# Patient Record
Sex: Male | Born: 1945 | Race: White | Hispanic: No | Smoking: Former smoker
Health system: Southern US, Community
[De-identification: ages and names within clinical notes are randomized; demographics above are authoritative.]

## PROBLEM LIST (undated history)

## (undated) DIAGNOSIS — J939 Pneumothorax, unspecified: Secondary | ICD-10-CM

## (undated) DIAGNOSIS — D45 Polycythemia vera: Secondary | ICD-10-CM

## (undated) DIAGNOSIS — D519 Vitamin B12 deficiency anemia, unspecified: Secondary | ICD-10-CM

## (undated) DIAGNOSIS — F0390 Unspecified dementia without behavioral disturbance: Secondary | ICD-10-CM

## (undated) DIAGNOSIS — N179 Acute kidney failure, unspecified: Secondary | ICD-10-CM

## (undated) DIAGNOSIS — J9819 Other pulmonary collapse: Secondary | ICD-10-CM

## (undated) DIAGNOSIS — C679 Malignant neoplasm of bladder, unspecified: Secondary | ICD-10-CM

## (undated) DIAGNOSIS — I639 Cerebral infarction, unspecified: Secondary | ICD-10-CM

## (undated) DIAGNOSIS — D751 Secondary polycythemia: Secondary | ICD-10-CM

## (undated) DIAGNOSIS — F03C0 Unspecified dementia, severe, without behavioral disturbance, psychotic disturbance, mood disturbance, and anxiety: Secondary | ICD-10-CM

## (undated) DIAGNOSIS — N189 Chronic kidney disease, unspecified: Secondary | ICD-10-CM

## (undated) HISTORY — DX: Cerebral infarction, unspecified: I63.9

## (undated) HISTORY — DX: Unspecified dementia without behavioral disturbance: F03.90

## (undated) HISTORY — DX: Other pulmonary collapse: J98.19

## (undated) HISTORY — DX: Vitamin B12 deficiency anemia, unspecified: D51.9

## (undated) HISTORY — DX: Malignant neoplasm of bladder, unspecified: C67.9

## (undated) HISTORY — DX: Acute kidney failure, unspecified: N17.9

## (undated) HISTORY — DX: Secondary polycythemia: D75.1

## (undated) HISTORY — DX: Acute kidney failure, unspecified: N18.9

## (undated) HISTORY — DX: Unspecified dementia, unspecified severity, without behavioral disturbance, psychotic disturbance, mood disturbance, and anxiety: F03.90

## (undated) HISTORY — DX: Unspecified dementia, severe, without behavioral disturbance, psychotic disturbance, mood disturbance, and anxiety: F03.C0

## (undated) HISTORY — DX: Polycythemia vera: D45

---

## 2005-03-19 ENCOUNTER — Emergency Department (HOSPITAL_COMMUNITY): Admission: EM | Admit: 2005-03-19 | Discharge: 2005-03-19 | Payer: Self-pay | Admitting: Emergency Medicine

## 2010-02-01 ENCOUNTER — Emergency Department (HOSPITAL_BASED_OUTPATIENT_CLINIC_OR_DEPARTMENT_OTHER): Admission: EM | Admit: 2010-02-01 | Discharge: 2010-02-02 | Payer: Self-pay | Admitting: Emergency Medicine

## 2010-02-01 ENCOUNTER — Ambulatory Visit: Payer: Self-pay | Admitting: Psychiatry

## 2010-02-02 ENCOUNTER — Inpatient Hospital Stay (HOSPITAL_COMMUNITY): Admission: RE | Admit: 2010-02-02 | Discharge: 2010-02-04 | Payer: Self-pay | Admitting: Psychiatry

## 2010-08-23 LAB — URINALYSIS, ROUTINE W REFLEX MICROSCOPIC
Bilirubin Urine: NEGATIVE
Glucose, UA: NEGATIVE mg/dL
Hgb urine dipstick: NEGATIVE
Specific Gravity, Urine: 1.015 (ref 1.005–1.030)
pH: 5 (ref 5.0–8.0)

## 2010-08-23 LAB — COMPREHENSIVE METABOLIC PANEL
ALT: 276 U/L — ABNORMAL HIGH (ref 0–53)
Albumin: 4.2 g/dL (ref 3.5–5.2)
Calcium: 9.1 mg/dL (ref 8.4–10.5)
Glucose, Bld: 89 mg/dL (ref 70–99)
Potassium: 4.4 mEq/L (ref 3.5–5.1)
Sodium: 141 mEq/L (ref 135–145)
Total Protein: 8.2 g/dL (ref 6.0–8.3)

## 2010-08-23 LAB — ETHANOL: Alcohol, Ethyl (B): 235 mg/dL — ABNORMAL HIGH (ref 0–10)

## 2010-08-23 LAB — CBC
HCT: 47.5 % (ref 39.0–52.0)
MCHC: 35.3 g/dL (ref 30.0–36.0)
Platelets: 174 10*3/uL (ref 150–400)
RDW: 12.5 % (ref 11.5–15.5)
WBC: 4.7 10*3/uL (ref 4.0–10.5)

## 2010-08-23 LAB — DIFFERENTIAL
Lymphs Abs: 1.7 10*3/uL (ref 0.7–4.0)
Monocytes Absolute: 0.4 10*3/uL (ref 0.1–1.0)
Monocytes Relative: 9 % (ref 3–12)
Neutro Abs: 2.4 10*3/uL (ref 1.7–7.7)
Neutrophils Relative %: 51 % (ref 43–77)

## 2010-08-23 LAB — POCT TOXICOLOGY PANEL

## 2010-08-23 LAB — PROTIME-INR: INR: 0.86 (ref 0.00–1.49)

## 2014-09-03 ENCOUNTER — Emergency Department (HOSPITAL_BASED_OUTPATIENT_CLINIC_OR_DEPARTMENT_OTHER): Payer: Medicare Other

## 2014-09-03 ENCOUNTER — Emergency Department (HOSPITAL_BASED_OUTPATIENT_CLINIC_OR_DEPARTMENT_OTHER)
Admission: EM | Admit: 2014-09-03 | Discharge: 2014-09-03 | Disposition: A | Discharge status: Other Acute Inpatient Hospital | Payer: Medicare Other | Attending: Emergency Medicine | Admitting: Emergency Medicine

## 2014-09-03 ENCOUNTER — Encounter (HOSPITAL_BASED_OUTPATIENT_CLINIC_OR_DEPARTMENT_OTHER): Payer: Self-pay | Admitting: *Deleted

## 2014-09-03 DIAGNOSIS — Z8709 Personal history of other diseases of the respiratory system: Secondary | ICD-10-CM | POA: Insufficient documentation

## 2014-09-03 DIAGNOSIS — Z87891 Personal history of nicotine dependence: Secondary | ICD-10-CM | POA: Diagnosis not present

## 2014-09-03 DIAGNOSIS — R42 Dizziness and giddiness: Secondary | ICD-10-CM

## 2014-09-03 HISTORY — DX: Pneumothorax, unspecified: J93.9

## 2014-09-03 LAB — URINALYSIS, ROUTINE W REFLEX MICROSCOPIC
GLUCOSE, UA: NEGATIVE mg/dL
Hgb urine dipstick: NEGATIVE
Ketones, ur: 15 mg/dL — AB
Nitrite: NEGATIVE
PROTEIN: NEGATIVE mg/dL
SPECIFIC GRAVITY, URINE: 1.015 (ref 1.005–1.030)
UROBILINOGEN UA: 0.2 mg/dL (ref 0.0–1.0)
pH: 5.5 (ref 5.0–8.0)

## 2014-09-03 LAB — RAPID URINE DRUG SCREEN, HOSP PERFORMED
AMPHETAMINES: NOT DETECTED
BARBITURATES: NOT DETECTED
BENZODIAZEPINES: NOT DETECTED
COCAINE: NOT DETECTED
Opiates: NOT DETECTED
Tetrahydrocannabinol: NOT DETECTED

## 2014-09-03 LAB — CBC
HEMATOCRIT: 69.1 % — AB (ref 39.0–52.0)
HEMATOCRIT: 67.1 % — AB (ref 39.0–52.0)
HEMOGLOBIN: 24 g/dL — AB (ref 13.0–17.0)
Hemoglobin: 23.2 g/dL (ref 13.0–17.0)
MCH: 35.6 pg — ABNORMAL HIGH (ref 26.0–34.0)
MCH: 35.5 pg — ABNORMAL HIGH (ref 26.0–34.0)
MCHC: 34.7 g/dL (ref 30.0–36.0)
MCHC: 34.6 g/dL (ref 30.0–36.0)
MCV: 102.5 fL — ABNORMAL HIGH (ref 78.0–100.0)
MCV: 102.8 fL — ABNORMAL HIGH (ref 78.0–100.0)
Platelets: 140 10*3/uL — ABNORMAL LOW (ref 150–400)
Platelets: 125 10*3/uL — ABNORMAL LOW (ref 150–400)
RBC: 6.74 MIL/uL — AB (ref 4.22–5.81)
RBC: 6.53 MIL/uL — AB (ref 4.22–5.81)
RDW: 15.4 % (ref 11.5–15.5)
RDW: 14.9 % (ref 11.5–15.5)
WBC: 5.3 10*3/uL (ref 4.0–10.5)
WBC: 5.3 10*3/uL (ref 4.0–10.5)

## 2014-09-03 LAB — COMPREHENSIVE METABOLIC PANEL
ALK PHOS: 52 U/L (ref 39–117)
ALT: 15 U/L (ref 0–53)
AST: 21 U/L (ref 0–37)
Albumin: 4 g/dL (ref 3.5–5.2)
Anion gap: 8 (ref 5–15)
BILIRUBIN TOTAL: 2 mg/dL — AB (ref 0.3–1.2)
BUN: 11 mg/dL (ref 6–23)
CO2: 29 mmol/L (ref 19–32)
Calcium: 9.4 mg/dL (ref 8.4–10.5)
Chloride: 100 mmol/L (ref 96–112)
Creatinine, Ser: 0.98 mg/dL (ref 0.50–1.35)
GFR calc Af Amer: >90 mL/min (ref >90)
GFR, EST NON AFRICAN AMERICAN: 82 mL/min — AB (ref >90)
GLUCOSE: 104 mg/dL — AB (ref 70–99)
POTASSIUM: 4.1 mmol/L (ref 3.5–5.1)
Sodium: 137 mmol/L (ref 135–145)
TOTAL PROTEIN: 7.5 g/dL (ref 6.0–8.3)

## 2014-09-03 LAB — PROTIME-INR
INR: 0.98 (ref 0.00–1.49)
Prothrombin Time: 13 seconds (ref 11.6–15.2)

## 2014-09-03 LAB — URINE MICROSCOPIC-ADD ON

## 2014-09-03 LAB — APTT: APTT: 27 s (ref 24–37)

## 2014-09-03 LAB — TROPONIN I

## 2014-09-03 MED ORDER — SODIUM CHLORIDE 0.9 % IV BOLUS (SEPSIS)
1000.0000 mL | INTRAVENOUS | Status: AC
  Administered 2014-09-03: 1000 mL via INTRAVENOUS

## 2014-09-03 MED ORDER — HYDRALAZINE HCL 20 MG/ML IJ SOLN
5.0000 mg | Freq: Once | INTRAMUSCULAR | Status: AC
  Administered 2014-09-03: 5 mg via INTRAVENOUS
  Filled 2014-09-03: qty 1

## 2014-09-03 NOTE — ED Notes (Signed)
Pt reports dizziness since yesterday am- denies pain- drips strong facial symmetry present- went to [facility] and was found to be hypertensive and was sent here for further eval

## 2014-09-03 NOTE — ED Notes (Signed)
CRITICAL VALUE ALERT  Critical value received:  Hgb 24.0  Date of notification:  09/03/2014  Time of notification:  8250  Critical value read back:Yes.    Nurse who received alert:  Glade Nurse, RN  MD notified (1st page):  Dr. Aline Brochure  Time of first page:  1619 (Dr. Aline Brochure in department)  MD notified (2nd page):  Time of second page:  Responding MD:  Dr. Aline Brochure  Time MD responded:  [phone]

## 2014-09-03 NOTE — ED Notes (Signed)
Lab called with panic Hgb-24

## 2014-09-03 NOTE — ED Notes (Signed)
CRITICAL VALUE ALERT  Critical value received:  Hgb 23.2  Date of notification:  09/03/2014  Time of notification:  3794  Critical value read back:Yes.    Nurse who received alert:  Annie Paras RN  MD notified (1st page):  Dr. Aline Brochure (in department)  Time of first page:  1745  MD notified (2nd page):  Time of second page:  Responding MD:  Dr. Aline Brochure  Time MD responded:  [phone]

## 2014-09-03 NOTE — ED Provider Notes (Signed)
CSN: [id number]     Arrival date & time 09/03/14  1412 History   First MD Initiated Contact with Patient 09/03/14 1449     Chief Complaint  Patient presents with  . Dizziness     (Consider location/radiation/quality/duration/timing/severity/associated sxs/prior Treatment) Patient is a 69 y.o. male presenting with dizziness. The history is provided by the patient.  Dizziness Quality:  Imbalance Severity:  Mild Onset quality:  Gradual Duration:  1 day Timing:  Constant Progression:  Unchanged Chronicity:  New Context: standing up   Relieved by:  Nothing Worsened by:  Standing up Ineffective treatments:  None tried Associated symptoms: no chest pain, no diarrhea, no headaches, no nausea, no shortness of breath and no vomiting     Past Medical History  Diagnosis Date  . Pneumothorax    History reviewed. No pertinent past surgical history. No family history on file. History  Substance Use Topics  . Smoking status: Former Research scientist (life sciences)  . Smokeless tobacco: Current User    Types: Chew  . Alcohol Use: 1.8 oz/week    3 Cans of beer per week     Comment: 3 beers/day    Review of Systems  Constitutional: Negative for fever.  HENT: Negative for drooling and rhinorrhea.   Eyes: Negative for pain.  Respiratory: Negative for cough and shortness of breath.   Cardiovascular: Negative for chest pain and leg swelling.  Gastrointestinal: Negative for nausea, vomiting, abdominal pain and diarrhea.  Genitourinary: Negative for dysuria and hematuria.  Musculoskeletal: Negative for gait problem and neck pain.  Skin: Negative for color change.  Neurological: Positive for dizziness. Negative for numbness and headaches.  Hematological: Negative for adenopathy.  Psychiatric/Behavioral: Negative for behavioral problems.  All other systems reviewed and are negative.     Allergies  Review of patient's allergies indicates no known allergies.  Home Medications   Prior to Admission  medications   Not on File   BP 215/97 mmHg  Pulse 65  Temp(Src) 97.6 F (36.4 C) (Oral)  Resp 16  Ht 5\' 6"  (1.676 m)  Wt 150 lb (68.04 kg)  BMI 24.22 kg/m2  SpO2 100% Physical Exam  Constitutional: He is oriented to person, place, and time. He appears well-developed and well-nourished.  HENT:  Head: Normocephalic and atraumatic.  Right Ear: External ear normal.  Left Ear: External ear normal.  Nose: Nose normal.  Mouth/Throat: Oropharynx is clear and moist. No oropharyngeal exudate.  Eyes: Conjunctivae and EOM are normal. Pupils are equal, round, and reactive to light.  Neck: Normal range of motion. Neck supple.  Cardiovascular: Normal rate, regular rhythm, normal heart sounds and intact distal pulses.  Exam reveals no gallop and no friction rub.   No murmur heard. Pulmonary/Chest: Effort normal and breath sounds normal. No respiratory distress. He has no wheezes.  Abdominal: Soft. Bowel sounds are normal. He exhibits no distension. There is no tenderness. There is no rebound and no guarding.  Musculoskeletal: Normal range of motion. He exhibits no edema or tenderness.  Neurological: He is alert and oriented to person, place, and time.  alert, oriented x3 speech: normal in context and clarity memory: intact grossly cranial nerves II-XII: intact motor strength: full proximally and distally no involuntary movements or tremors sensation: intact to light touch diffusely  cerebellar: finger-to-nose and heel-to-shin intact gait: The patient leans slightly to the left having to catch his balance when walking forwards and backwards., Romberg negative.  Skin: Skin is warm and dry.  Mild macular rash noted on the lateral  aspect of the left shin, lateral aspect of the right hip, and lower back.  Psychiatric: He has a normal mood and affect. His behavior is normal.  Nursing note and vitals reviewed.   ED Course  Procedures (including critical care time) Labs Review Labs Reviewed   CBC - Abnormal; Notable for the following:    RBC 6.74 (*)    Hemoglobin 24.0 (*)    HCT 69.1 (*)    MCV 102.5 (*)    MCH 35.6 (*)    Platelets 140 (*)    All other components within normal limits  COMPREHENSIVE METABOLIC PANEL - Abnormal; Notable for the following:    Glucose, Bld 104 (*)    Total Bilirubin 2.0 (*)    GFR calc non Af Amer 82 (*)    All other components within normal limits  URINALYSIS, ROUTINE W REFLEX MICROSCOPIC - Abnormal; Notable for the following:    Color, Urine AMBER (*)    APPearance CLOUDY (*)    Bilirubin Urine SMALL (*)    Ketones, ur 15 (*)    Leukocytes, UA TRACE (*)    All other components within normal limits  CBC - Abnormal; Notable for the following:    RBC 6.53 (*)    Hemoglobin 23.2 (*)    HCT 67.1 (*)    MCV 102.8 (*)    MCH 35.5 (*)    Platelets 125 (*)    All other components within normal limits  URINE RAPID DRUG SCREEN (HOSP PERFORMED)  TROPONIN I  URINE MICROSCOPIC-ADD ON  PROTIME-INR  APTT    Imaging Review Dg Chest 2 View  09/03/2014   CLINICAL DATA:  69 year old male with dizziness since yesterday morning.  EXAM: CHEST  2 VIEW  COMPARISON:  No priors.  FINDINGS: Lung volumes are normal. No consolidative airspace disease. No pleural effusions. No pneumothorax. No pulmonary nodule or mass noted. Pulmonary vasculature and the cardiomediastinal silhouette are within normal limits.  IMPRESSION: No radiographic evidence of acute cardiopulmonary disease.   Electronically Signed   By: Vinnie Langton M.D.   On: 09/03/2014 15:45   Ct Head Wo Contrast  09/03/2014   CLINICAL DATA:  Dizziness and gait disturbance  EXAM: CT HEAD WITHOUT CONTRAST  TECHNIQUE: Contiguous axial images were obtained from the base of the skull through the vertex without intravenous contrast.  COMPARISON:  None.  FINDINGS: There is mild diffuse atrophy. There is no intracranial mass, hemorrhage, extra-axial fluid collection, or midline shift. There is mild small  vessel disease in the centra semiovale bilaterally. Elsewhere gray-white compartments are normal. No acute infarct is apparent. The bony calvarium appears intact. The mastoid air cells are clear.  IMPRESSION: Atrophy with mild periventricular small vessel disease. No intracranial mass, hemorrhage, or acute appearing infarct.   Electronically Signed   By: Lowella Grip III M.D.   On: 09/03/2014 15:25     EKG Interpretation   Date/Time:  Sunday September 03 2014 14:24:11 EDT Ventricular Rate:  68 PR Interval:  150 QRS Duration: 88 QT Interval:  370 QTC Calculation: 393 R Axis:   67 Text Interpretation:  Normal sinus rhythm with sinus arrhythmia Septal  infarct , age undetermined Abnormal ECG No old tracing to compare  Confirmed by [facility]  MD, MARTHA [phone]) on 09/03/2014 2:47:13 PM      MDM   Final diagnoses:  Dizziness    3:20 PM 69 y.o. male with remote history of pneumothorax who presents with dizziness which began upon awakening yesterday. He  states it is worse with ambulation and he feels off balance. He denies any chest pain, shortness of breath, fever, headache, vomiting. He was seen in urgent care and sent here due to elevated blood pressure. He is hypertensive here for vital signs are otherwise unremarkable. He does have some imbalance with walking forwards and backwards. He is otherwise neurologically intact. We'll get screening labs and imaging.  Pt remains slightly ataxic after tx and workup. Case discussed with hospitalist at [facility] who will admit the patient.  Pamella Pert, MD 09/04/14 [phone]

## 2015-11-08 IMAGING — CT CT HEAD W/O CM
1 series · 16 of 28 positions shown, 20 images · non-contrast
Comparison: None.

CLINICAL DATA: Dizziness and gait disturbance

EXAM:
CT HEAD WITHOUT CONTRAST
TECHNIQUE: Contiguous axial images were obtained from the base of the skull
through the vertex without intravenous contrast.

[Series 2: head 4.8 h37s · axial · 0.45mm/px · z∈[-123,+3]mm · 16 of 28 slices shown, 20 images]
[im 2/28  brain]
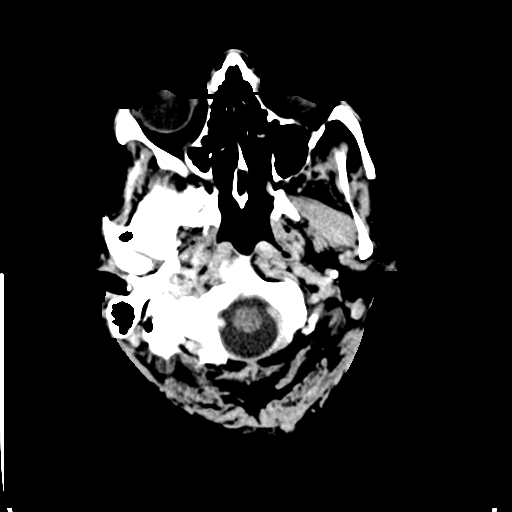
[im 2/28  bone]
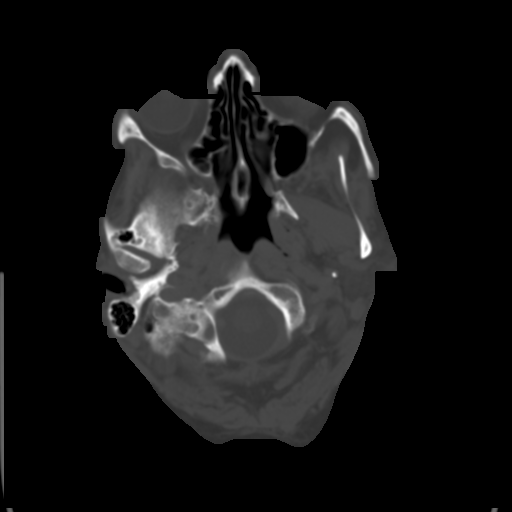
[im 4/28  brain]
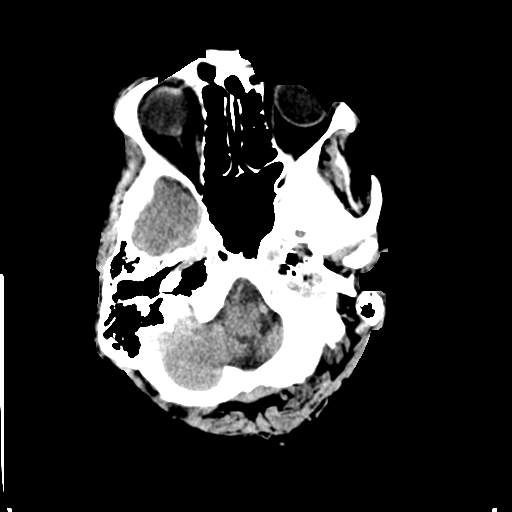
[im 6/28  brain]
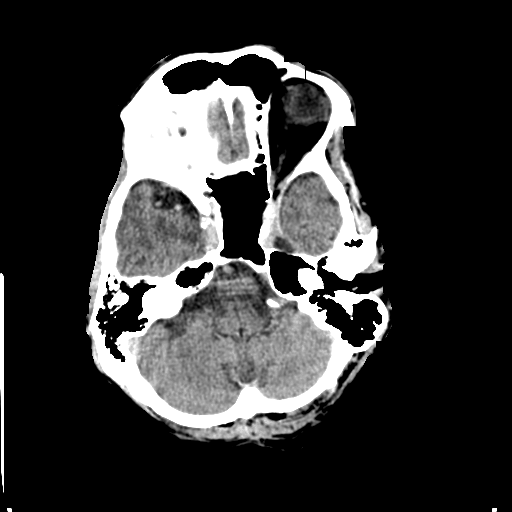
[im 7/28  brain]
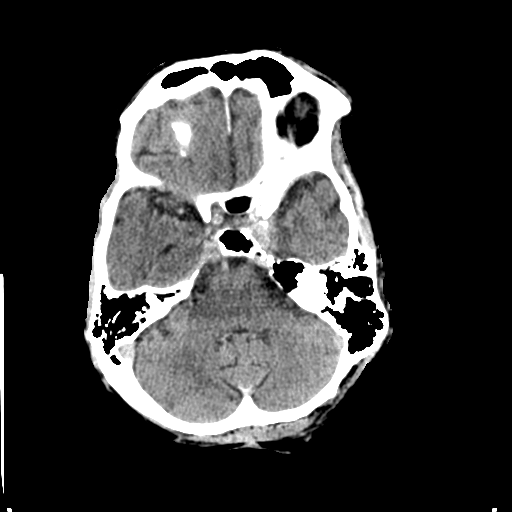
[im 9/28  brain]
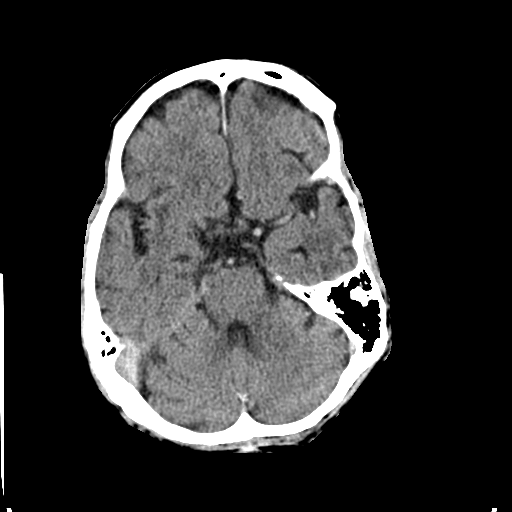
[im 9/28  bone]
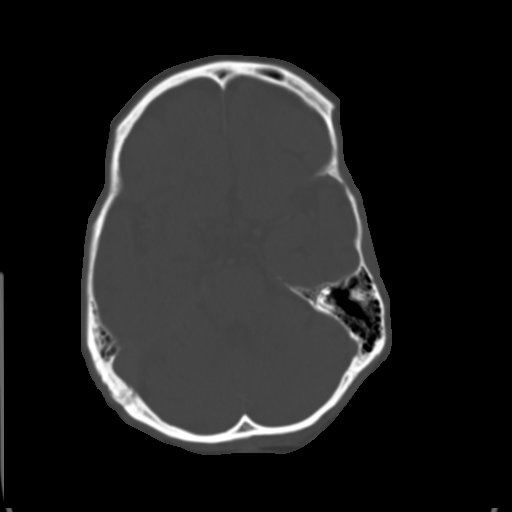
[im 10/28  brain]
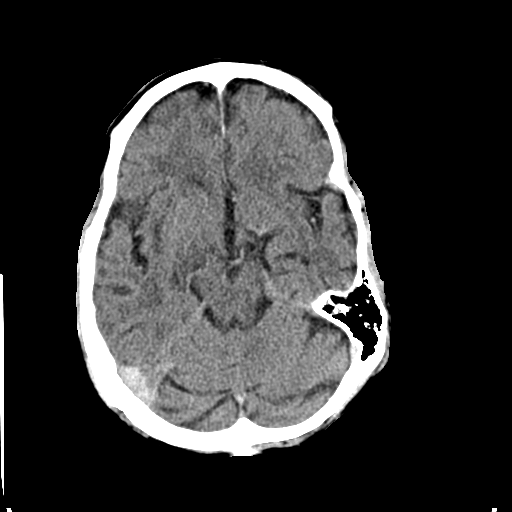
[im 12/28  brain]
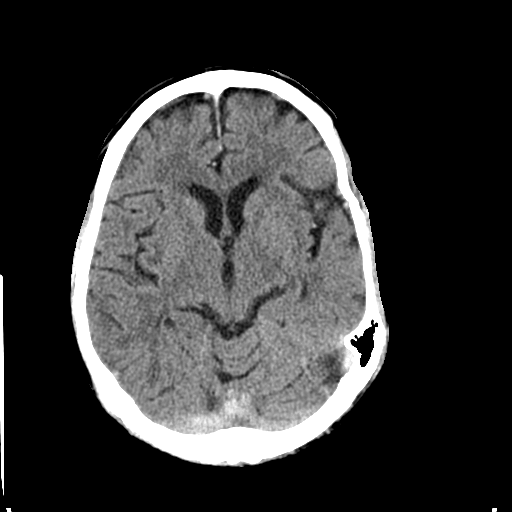
[im 14/28  brain]
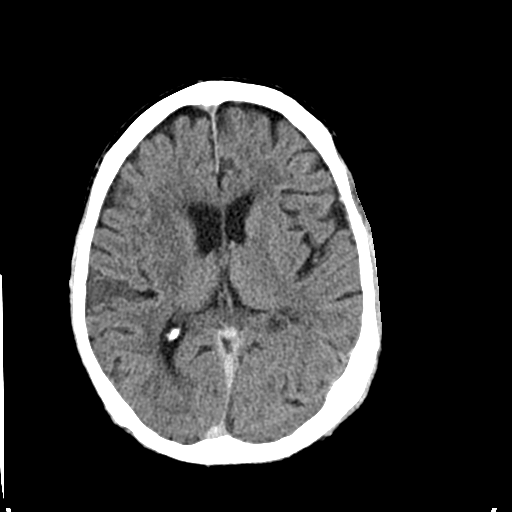
[im 15/28  brain]
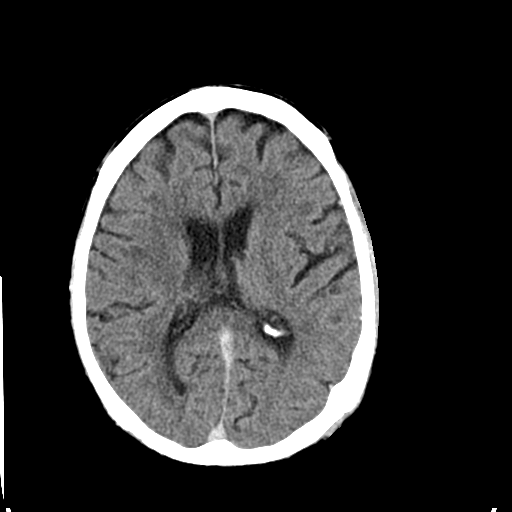
[im 15/28  bone]
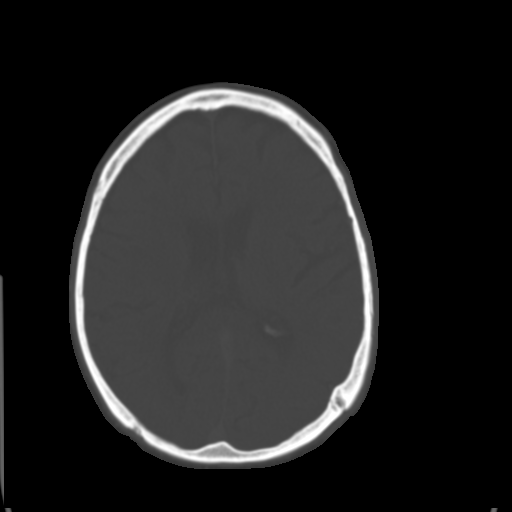
[im 17/28  brain]
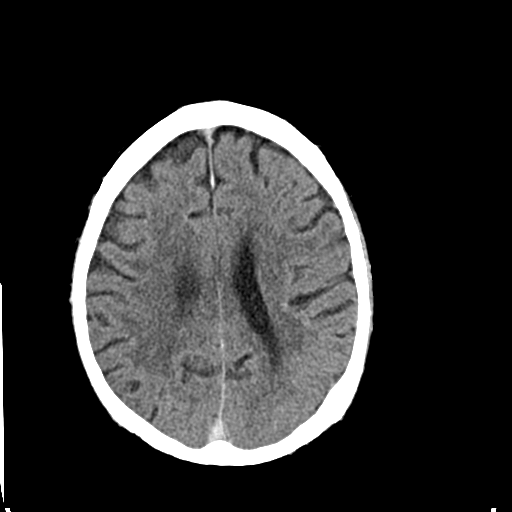
[im 19/28  brain]
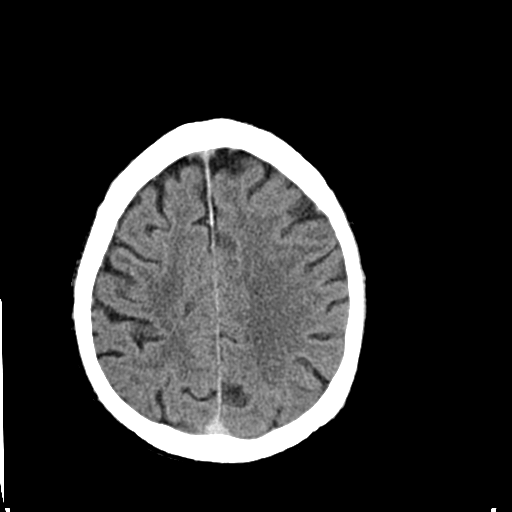
[im 20/28  brain]
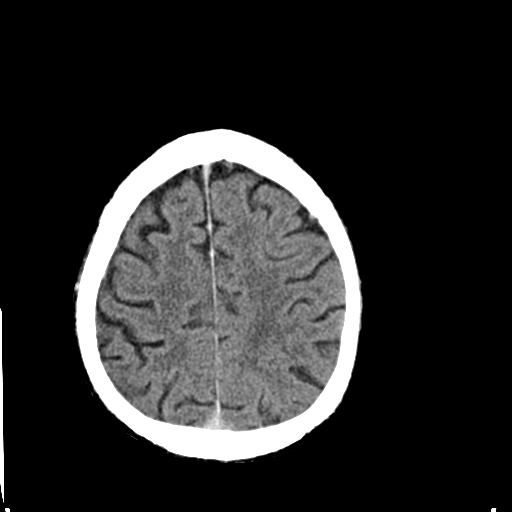
[im 22/28  brain]
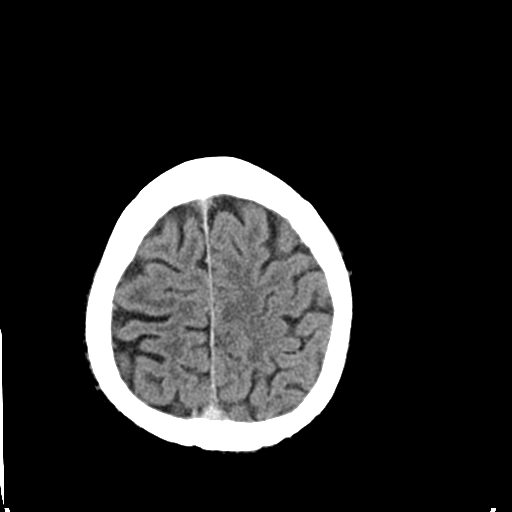
[im 22/28  bone]
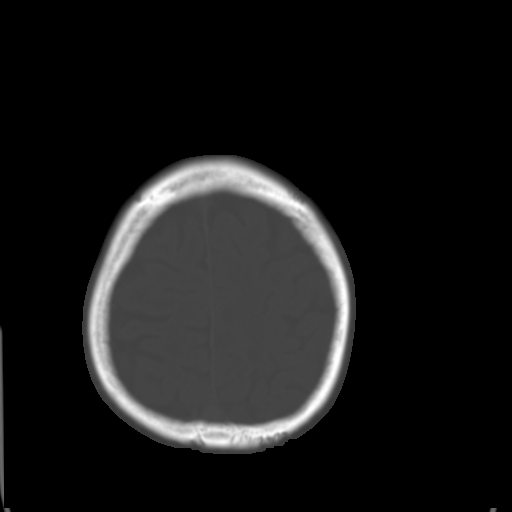
[im 23/28  brain]
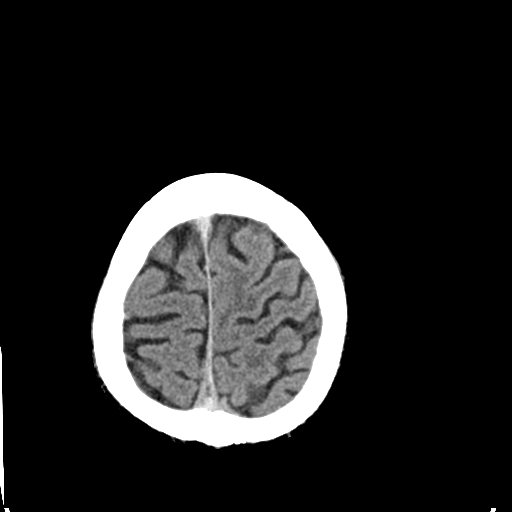
[im 25/28  brain]
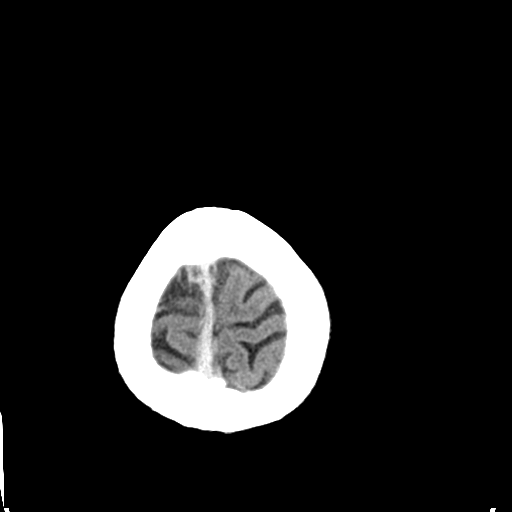
[im 27/28  brain]
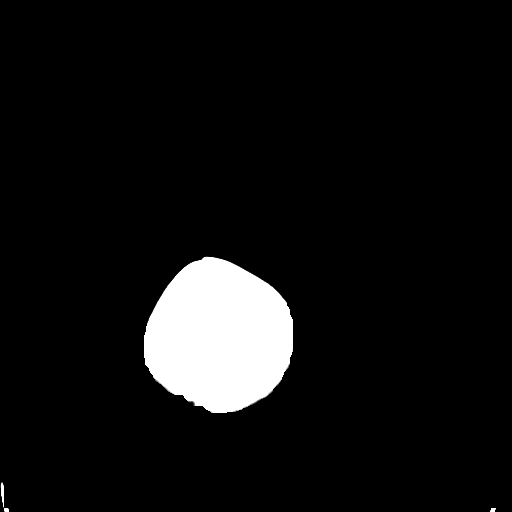

[16 of 28 positions shown; findings below may reference images not displayed]

FINDINGS: There is mild diffuse atrophy. There is no intracranial mass,
hemorrhage, extra-axial fluid collection, or midline shift. There is
mild small vessel disease in the centra semiovale bilaterally.
Elsewhere gray-white compartments are normal. No acute infarct is
apparent. The bony calvarium appears intact. The mastoid air cells
are clear.
IMPRESSION: Atrophy with mild periventricular small vessel disease. No
intracranial mass, hemorrhage, or acute appearing infarct.

## 2017-07-06 HISTORY — PX: TRANSURETHRAL RESECTION OF BLADDER TUMOR WITH GYRUS (TURBT-GYRUS): SHX6458

## 2017-07-06 HISTORY — PX: OTHER SURGICAL HISTORY: SHX169

## 2019-05-13 ENCOUNTER — Encounter: Payer: Self-pay | Admitting: Internal Medicine

## 2019-05-13 ENCOUNTER — Non-Acute Institutional Stay (SKILLED_NURSING_FACILITY): Payer: Medicare Other | Admitting: Internal Medicine

## 2019-05-13 DIAGNOSIS — A4150 Gram-negative sepsis, unspecified: Secondary | ICD-10-CM | POA: Diagnosis not present

## 2019-05-13 DIAGNOSIS — R6520 Severe sepsis without septic shock: Secondary | ICD-10-CM

## 2019-05-13 DIAGNOSIS — E872 Acidosis, unspecified: Secondary | ICD-10-CM

## 2019-05-13 DIAGNOSIS — A419 Sepsis, unspecified organism: Secondary | ICD-10-CM

## 2019-05-13 DIAGNOSIS — F0280 Dementia in other diseases classified elsewhere without behavioral disturbance: Secondary | ICD-10-CM | POA: Diagnosis not present

## 2019-05-13 DIAGNOSIS — D45 Polycythemia vera: Secondary | ICD-10-CM

## 2019-05-13 DIAGNOSIS — I680 Cerebral amyloid angiopathy: Secondary | ICD-10-CM

## 2019-05-13 DIAGNOSIS — N179 Acute kidney failure, unspecified: Secondary | ICD-10-CM

## 2019-05-13 DIAGNOSIS — N189 Chronic kidney disease, unspecified: Secondary | ICD-10-CM

## 2019-05-13 DIAGNOSIS — N390 Urinary tract infection, site not specified: Secondary | ICD-10-CM

## 2019-05-13 DIAGNOSIS — R627 Adult failure to thrive: Secondary | ICD-10-CM

## 2019-05-13 LAB — CBC AND DIFFERENTIAL
HCT: 37 — AB (ref 41–53)
Hemoglobin: 11.8 — AB (ref 13.5–17.5)
Neutrophils Absolute: 6
Platelets: 103 — AB (ref 150–399)
WBC: 7.7

## 2019-05-13 LAB — BASIC METABOLIC PANEL
BUN: 23 — AB (ref 4–21)
CO2: 15 (ref 13–22)
Chloride: 116 — AB (ref 99–108)
Creatinine: 1.4 — AB (ref 0.6–1.3)
Glucose: 98
Potassium: 3.7 (ref 3.4–5.3)
Sodium: 144 (ref 137–147)

## 2019-05-13 LAB — COMPREHENSIVE METABOLIC PANEL
Calcium: 8.1 — AB (ref 8.7–10.7)
GFR calc Af Amer: 58.34
GFR calc non Af Amer: 50.34

## 2019-05-13 LAB — CBC: RBC: 4.12 (ref 3.87–5.11)

## 2019-05-13 NOTE — Progress Notes (Signed)
: Provider:  Hennie Duos., MD Location:  [facility] Room Number: 509-P Place of Service:  SNF (31)  PCP: Patient, No Pcp Per Patient Care Team: Patient, No Pcp Per as PCP - General (General Practice)  Extended Emergency Contact Information Primary Emergency Contact: Lillia Corporal [place] Phone: [phone] Relation: Son Secondary Emergency Contact: [facility] Address: [address]          [place], [place] [address] [place] Phone: [phone] Relation: Daughter     Allergies: Patient has no known allergies.  Chief Complaint  Patient presents with   New Admit To SNF    New admission to [facility] SNF    HPI: Patient is a 73 y.o. male with severe dementia, amyloid angiopathy, stroke, urinary bladder cancer, polycythemia encounter, who was admitted to [facility] from 11/28-12/3 for decreased appetite and p.o. intake for the prior 2 weeks and decreased mobility for the past month.  Daughter reported 2 weeks prior was eating at least 1 meal a day past 2 weeks has eaten nothing except small sips of water.  Patient has been bedbound for 1 month per daughter due to weakness patient has had a significant decline functionally and cognitively..  At the onset it was discussed with the daughter that patient's living will describes a DNR and the daughter would like to make him DNR.  It was discussed with daughter regarding hospice center versus hospice home as she does not want to pursue alternative means of nutrition and would want minimal treatment IV fluids and antibiotics.  Patient was admitted found to be septic from a UTI with Aerococcus, treated with Rocephin, and acute renal failure.  His creatinine increased from baseline of 1.5-3.17 and at discharge had improved to 1.8 patient had metabolic acidosis secondary to acute on chronic kidney failure and he was started on sodium bicarb.  Patient  is admitted to skilled nursing facility for residential care as he cannot be cared for at home.  Patient will be hospice care.  Past Medical History:  Diagnosis Date   B12 deficiency anemia    Bladder cancer ([place])    Cancer ([place])    Collapsed lung    CVA (cerebral vascular accident) ([place])    Dementia ([place])    Pneumothorax    Polycythemia    Polycythemia vera ([place])    Stroke [facility])     Past Surgical History:  Procedure Laterality Date   BLADDER SURGERY     CYSTOSCOPY WITH BLADDER BIOPSY  07/06/2017   INSTILLATION OF INTRAVESICAL CHEMOTHERAPY  07/06/2017   LUNG SURGERY     RETROGRADE PYLEGRAM Bilateral 07/06/2017   TRANSURETHRAL RESECTION OF BLADDER TUMOR WITH GYRUS (TURBT-GYRUS)  07/06/2017    Allergies as of 05/13/2019   No Known Allergies     Medication List       Accurate as of May 13, 2019  4:51 PM. If you have any questions, ask your nurse or doctor.        bisacodyl 10 MG suppository Commonly known as: DULCOLAX Place 10 mg rectally daily as needed for moderate constipation.   hydroxyurea 500 MG capsule Commonly known as: HYDREA Take 500 mg by mouth every other day. May take with food to minimize GI side effects.   magnesium hydroxide 400 MG/5ML suspension Commonly known as: MILK OF MAGNESIA Take 30 mLs by mouth daily as needed for mild constipation.   sodium bicarbonate 650 MG tablet  Take 650 mg by mouth 3 (three) times daily.   thiamine 100 MG tablet Take 100 mg by mouth daily.       No orders of the defined types were placed in this encounter.    There is no immunization history on file for this patient.  Social History   Tobacco Use   Smoking status: Former Smoker   Smokeless tobacco: Current User    Types: Chew  Substance Use Topics   Alcohol use: Yes    Alcohol/week: 3.0 standard drinks    Types: 3 Cans of beer per week    Comment: 3 beers/day    Family history is   Family History  Problem Relation Age of  Onset   Cancer Mother    Heart disease Mother    Hypertension Sister    Stroke Son    Clotting disorder Neg Hx    Allergic rhinitis Neg Hx    Hearing loss Neg Hx    Osteoporosis Neg Hx    Diabetes Neg Hx    Migraines Neg Hx       Review of Systems     unable to obtain secondary to dementia; nursing-poor appetite      Vitals:   05/13/19 1425  BP: 110/60  Pulse: 78  Resp: 18  Temp: 98 F (36.7 C)  SpO2: 91%    SpO2 Readings from Last 1 Encounters:  05/13/19 91%   Body mass index is 24.21 kg/m.     Physical Exam  GENERAL APPEARANCE: Semialert no acute distress.  SKIN: No diaphoresis rash HEAD: Normocephalic, atraumatic  EYES: Conjunctiva/lids clear. Pupils round, reactive. EOMs intact.  EARS: External exam WNL, canals clear. Hearing grossly normal.  NOSE: No deformity or discharge.  MOUTH/THROAT: Lips w/o lesions  RESPIRATORY: Breathing is even, unlabored. Lung sounds are clear   CARDIOVASCULAR: Heart RRR no murmurs, rubs or gallops. No peripheral edema.   GASTROINTESTINAL: Abdomen is soft, non-tender, not distended w/ normal bowel sounds. GENITOURINARY: Bladder non tender, not distended  MUSCULOSKELETAL: No abnormal joints or musculature NEUROLOGIC:  Cranial nerves 2-12 grossly intact.;  Did not move during interview PSYCHIATRIC: Unable to obtain secondary to dementia severe  There are no active problems to display for this patient.     Labs reviewed: Basic Metabolic Panel:    Component Value Date/Time   NA 137 09/03/2014 1440   K 4.1 09/03/2014 1440   CL 100 09/03/2014 1440   CO2 29 09/03/2014 1440   GLUCOSE 104 (H) 09/03/2014 1440   BUN 11 09/03/2014 1440   CREATININE 0.98 09/03/2014 1440   CALCIUM 9.4 09/03/2014 1440   PROT 7.5 09/03/2014 1440   ALBUMIN 4.0 09/03/2014 1440   AST 21 09/03/2014 1440   ALT 15 09/03/2014 1440   ALKPHOS 52 09/03/2014 1440   BILITOT 2.0 (H) 09/03/2014 1440   GFRNONAA 82 (L) 09/03/2014 1440   GFRAA >90  09/03/2014 1440    No results for input(s): NA, K, CL, CO2, GLUCOSE, BUN, CREATININE, CALCIUM, MG, PHOS in the last 8760 hours. Liver Function Tests: No results for input(s): AST, ALT, ALKPHOS, BILITOT, PROT, ALBUMIN in the last 8760 hours. No results for input(s): LIPASE, AMYLASE in the last 8760 hours. No results for input(s): AMMONIA in the last 8760 hours. CBC: No results for input(s): WBC, NEUTROABS, HGB, HCT, MCV, PLT in the last 8760 hours. Lipid No results for input(s): CHOL, HDL, LDLCALC, TRIG in the last 8760 hours.  Cardiac Enzymes: No results for input(s): CKTOTAL, CKMB, CKMBINDEX,  TROPONINI in the last 8760 hours. BNP: No results for input(s): BNP in the last 8760 hours. No results found for: MICROALBUR No results found for: HGBA1C Lab Results  Component Value Date   TSH 1.069 02/02/2010   No results found for: VITAMINB12 No results found for: FOLATE No results found for: IRON, TIBC, FERRITIN  Imaging and Procedures obtained prior to SNF admission: Dg Chest 2 View  Result Date: 09/03/2014 CLINICAL DATA:  73 year old male with dizziness since yesterday morning. EXAM: CHEST  2 VIEW COMPARISON:  No priors. FINDINGS: Lung volumes are normal. No consolidative airspace disease. No pleural effusions. No pneumothorax. No pulmonary nodule or mass noted. Pulmonary vasculature and the cardiomediastinal silhouette are within normal limits. IMPRESSION: No radiographic evidence of acute cardiopulmonary disease. Electronically Signed   By: Vinnie Langton M.D.   On: 09/03/2014 15:45   Ct Head Wo Contrast  Result Date: 09/03/2014 CLINICAL DATA:  Dizziness and gait disturbance EXAM: CT HEAD WITHOUT CONTRAST TECHNIQUE: Contiguous axial images were obtained from the base of the skull through the vertex without intravenous contrast. COMPARISON:  None. FINDINGS: There is mild diffuse atrophy. There is no intracranial mass, hemorrhage, extra-axial fluid collection, or midline shift. There  is mild small vessel disease in the centra semiovale bilaterally. Elsewhere gray-white compartments are normal. No acute infarct is apparent. The bony calvarium appears intact. The mastoid air cells are clear. IMPRESSION: Atrophy with mild periventricular small vessel disease. No intracranial mass, hemorrhage, or acute appearing infarct. Electronically Signed   By: Lowella Grip III M.D.   On: 09/03/2014 15:25     Not all labs, radiology exams or other studies done during hospitalization come through on my EPIC note; however they are reviewed by me.    Assessment and Plan  Sepsis/Aerococcus UTI-treated with IV Rocephin from 11/28-12/3. SNF-patient is admitted for hospice care.  Daughter had been interested in inpatient hospice but he did not qualify  Advanced dementia secondary to amyloid angiopathy/failure to thrive SNF-Patient was started on Exelon and Risperdal in the hospital, but with his hospice status, actually with his dementia status is doubtful that they will do anything to her they have been discontinued; continue supportive care; pain continue vitamin B1 100 mg daily patient will take  Acute kidney injury-baseline creatinine 1.45, went up to 3.17 and back down to 1.8 at discharge SNF-can do a follow-up BMP; is doubtful that would change anything  Polycythemia vera SNF-can continue hydroxyurea 500 mg capsule 1 every other day  Metabolic acidosis SNF-can continue sodium bicarb 1 tablet 3 times daily for 14 days if patient will take   Time spent greater than 45 minutes;> 50% of time with patient was spent reviewing records, labs, tests and studies, counseling and developing plan of care  Hennie Duos, MD

## 2019-05-14 ENCOUNTER — Encounter: Payer: Self-pay | Admitting: Internal Medicine

## 2019-05-14 DIAGNOSIS — E872 Acidosis, unspecified: Secondary | ICD-10-CM | POA: Insufficient documentation

## 2019-05-14 DIAGNOSIS — N189 Chronic kidney disease, unspecified: Secondary | ICD-10-CM | POA: Insufficient documentation

## 2019-05-14 DIAGNOSIS — A419 Sepsis, unspecified organism: Secondary | ICD-10-CM | POA: Insufficient documentation

## 2019-05-14 DIAGNOSIS — D45 Polycythemia vera: Secondary | ICD-10-CM | POA: Insufficient documentation

## 2019-05-14 DIAGNOSIS — N179 Acute kidney failure, unspecified: Secondary | ICD-10-CM | POA: Insufficient documentation

## 2019-05-14 DIAGNOSIS — F0280 Dementia in other diseases classified elsewhere without behavioral disturbance: Secondary | ICD-10-CM | POA: Insufficient documentation

## 2019-05-14 DIAGNOSIS — N390 Urinary tract infection, site not specified: Secondary | ICD-10-CM | POA: Insufficient documentation

## 2019-05-14 DIAGNOSIS — A4150 Gram-negative sepsis, unspecified: Secondary | ICD-10-CM | POA: Insufficient documentation

## 2019-05-14 DIAGNOSIS — R627 Adult failure to thrive: Secondary | ICD-10-CM | POA: Insufficient documentation

## 2019-05-14 DIAGNOSIS — I680 Cerebral amyloid angiopathy: Secondary | ICD-10-CM | POA: Insufficient documentation

## 2019-06-14 ENCOUNTER — Encounter: Payer: Self-pay | Admitting: Internal Medicine

## 2019-06-14 ENCOUNTER — Non-Acute Institutional Stay (SKILLED_NURSING_FACILITY): Payer: Medicare Other | Admitting: Internal Medicine

## 2019-06-14 DIAGNOSIS — D45 Polycythemia vera: Secondary | ICD-10-CM

## 2019-06-14 DIAGNOSIS — F0280 Dementia in other diseases classified elsewhere without behavioral disturbance: Secondary | ICD-10-CM

## 2019-06-14 DIAGNOSIS — E872 Acidosis, unspecified: Secondary | ICD-10-CM

## 2019-06-14 DIAGNOSIS — N189 Chronic kidney disease, unspecified: Secondary | ICD-10-CM

## 2019-06-14 DIAGNOSIS — N179 Acute kidney failure, unspecified: Secondary | ICD-10-CM | POA: Diagnosis not present

## 2019-06-14 NOTE — Progress Notes (Signed)
Location:  [facility] Room Number: 104-P Place of Service:  SNF [phone]) Provider:  Granville Lewis, PA-C  Patient Care Team: Patient, No Pcp Per as PCP - General (General Practice) Hennie Duos, MD as Consulting Physician (Internal Medicine)  Extended Emergency Contact Information Primary Emergency Contact: Zadie Rhine Address: [address]          [place], [place] [address] [name] of [place] Phone: [phone] Relation: Daughter Secondary Emergency Contact: Gaspar Skeeters Address: [address]          [place] [address] [name] of [place] Mobile Phone: [phone] Relation: Son  Code Status:  DNR Goals of care: Advanced Directive information Advanced Directives 06/14/2019  Does Patient Have a Medical Advance Directive? Yes  Type of Advance Directive Out of facility DNR (pink MOST or yellow form)  Does patient want to make changes to medical advance directive? No - Patient declined  Pre-existing out of facility DNR order (yellow form or pink MOST form) Yellow form placed in chart (order not valid for inpatient use)     Chief Complaint  Patient presents with  . Medical Management of Chronic Issues    Routine [facility] visit   Medical management of chronic medical conditions including advanced dementia-history of kidney injury-polycythemia vera- metabolic acidosis   HPI:  Pt is a 74 y.o. male seen today for medical management of chronic diseases.  As noted above.  Patient does have advanced dementia and per family wishes is under hospice care.  At one point he was on Exelon and Risperdal which was started in the hospital but this has been discontinued secondary to hospice status and minimization of medications.  He was hospitalized with a UTI and was treated treated aggressively with antibiotics before his admission to this facility.  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  He also had acute kidney injury in the hospital his creatinine did go up to 3.17 it was down to 1.8 at [facility] December 4 lab it was 1.37.  He also had metabolic acidosis and had been on sodium bicarb this has been discontinued I suspect secondary to patient not really taking it and emphasis on comfort care.  Currently he is lying in bed comfortably he is bright alert confused he does not appear to be in any discomfort nursing does not report any recent concerns.     Past Medical History:  Diagnosis Date  . Acute on chronic kidney failure ([place])   . 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 B12 deficiency anemia   . Bladder cancer ([place])   . Collapsed lung   . CVA (cerebral vascular accident) ([place])   . Dementia ([place])   . Pneumothorax   . Polycythemia   . Polycythemia vera ([place])   . Stroke [facility])    Past Surgical History:  Procedure Laterality Date  . BLADDER SURGERY    . CYSTOSCOPY WITH BLADDER BIOPSY  07/06/2017  . INSTILLATION OF INTRAVESICAL CHEMOTHERAPY  07/06/2017  . LUNG SURGERY    . RETROGRADE PYLEGRAM Bilateral 07/06/2017  . TRANSURETHRAL RESECTION OF BLADDER TUMOR WITH GYRUS (TURBT-GYRUS)  07/06/2017    No Known Allergies  Outpatient Encounter Medications as of 06/14/2019  Medication Sig  . bisacodyl (DULCOLAX) 10 MG suppository Place 10 mg rectally daily as needed for moderate constipation.  . hydroxyurea (HYDREA) 500 MG capsule Take 500 mg by mouth every other day.  May take with food to minimize GI side effects.  . magnesium hydroxide (MILK OF MAGNESIA) 400 MG/5ML suspension Take 30 mLs by mouth daily as needed for mild constipation.  . thiamine 100 MG tablet Take 100 mg by mouth daily.   No facility-administered encounter medications on file as of 06/14/2019.    Review of Systems   Is unobtainable secondary to dementia please see HPI  Immunization History  Administered Date(s) Administered  . Influenza, High Dose  Seasonal PF 05/12/2019  . Pneumococcal Polysaccharide-23 05/12/2019   Pertinent  Health Maintenance Due  Topic Date Due  . COLONOSCOPY  01/18/1995  . PNA vac Low Risk Adult (2 of 2 - PCV13) 05/11/2020  . INFLUENZA VACCINE  Completed   No flowsheet data found. Functional Status Survey:    Vitals:   06/14/19 0920  BP: 127/76  Pulse: 99  Resp: 19  Temp: 98.4 F (36.9 C)  TempSrc: Oral  Weight: 112 lb 3.2 oz (50.9 kg)  Height: 5\' 3"  (1.6 m)   Body mass index is 19.88 kg/m. Physical Exam In general this is a pleasant elderly male in no distress he does appear somewhat frail but in no discomfort.  His skin is warm and dry.  Eyes visual acuity appears to be intact sclera and conjunctive are clear.  Oropharynx appeared to be clear mucous membranes moist.  Chest is clear to auscultation with poor respiratory effort there is no labored breathing or congestion noted.  Heart is regular rate and rhythm without murmur gallop or rub he does not have significant lower extremity edema.  Abdomen is soft nontender with positive bowel sounds.  Musculoskeletal appears able to move all extremities x4 he does hold his legs in a somewhat of a bent position.  Neurologic appears grossly intact cannot appreciate lateralizing findings.  Psych findings consistent with significant dementia he does not really follow verbal commands.   Labs reviewed: Recent Labs    05/13/19 0000  NA 144  K 3.7  CL 116*  CO2 15  BUN 23*  CREATININE 1.4*  CALCIUM 8.1*   No results for input(s): AST, ALT, ALKPHOS, BILITOT, PROT, ALBUMIN in the last 8760 hours. Recent Labs    05/13/19 0000  WBC 7.7  NEUTROABS 6  HGB 11.8*  HCT 37*  PLT 103*   Lab Results  Component Value Date   TSH 1.069 02/02/2010   No results found for: HGBA1C No results found for: CHOL, HDL, LDLCALC, LDLDIRECT, TRIG, CHOLHDL  Significant Diagnostic Results in last 30 days:  No results found.  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  At this point continue supportive care he continues on thiamine as well.  2.  History of acute kidney injury again this improved it appeared when his UTI was treated-we have not ordered additional labs because of his hospice status but this appears to be stable his creatinine had gone down to baseline at 1.37 on most recent lab.  3.  History of metabolic acidosis had been on sodium bicarb but this has been discontinued secondary to minimize meds and apparently patient had difficulty taking it.  4.  History o polycythemia vera-he continues on hydroxyurea-last platelet count was 103,000  [id number]

## 2019-06-18 ENCOUNTER — Encounter: Payer: Self-pay | Admitting: Internal Medicine

## 2019-06-29 ENCOUNTER — Encounter: Payer: Self-pay | Admitting: Internal Medicine

## 2019-06-29 ENCOUNTER — Non-Acute Institutional Stay (SKILLED_NURSING_FACILITY): Payer: Medicare Other | Admitting: Internal Medicine

## 2019-06-29 DIAGNOSIS — N189 Chronic kidney disease, unspecified: Secondary | ICD-10-CM | POA: Diagnosis not present

## 2019-06-29 DIAGNOSIS — N179 Acute kidney failure, unspecified: Secondary | ICD-10-CM | POA: Diagnosis not present

## 2019-06-29 DIAGNOSIS — D649 Anemia, unspecified: Secondary | ICD-10-CM

## 2019-06-29 LAB — CBC AND DIFFERENTIAL
HCT: 28 — AB (ref 41–53)
Hemoglobin: 8.9 — AB (ref 13.5–17.5)
Platelets: 360 (ref 150–399)
WBC: 8.3

## 2019-06-29 LAB — BASIC METABOLIC PANEL
BUN: 25 — AB (ref 4–21)
CO2: 24 — AB (ref 13–22)
Chloride: 106 (ref 99–108)
Creatinine: 1 (ref 0.6–1.3)
Glucose: 101
Potassium: 4.6 (ref 3.4–5.3)
Sodium: 139 (ref 137–147)

## 2019-06-29 LAB — HEPATIC FUNCTION PANEL
ALT: 11 (ref 10–40)
AST: 24 (ref 14–40)
Alkaline Phosphatase: 179 — AB (ref 25–125)

## 2019-06-29 LAB — COMPREHENSIVE METABOLIC PANEL
Albumin: 2.7 — AB (ref 3.5–5.0)
Calcium: 8.7 (ref 8.7–10.7)
GFR calc Af Amer: 83.27
GFR calc non Af Amer: 71.84
Globulin: 3.7

## 2019-06-29 LAB — CBC: RBC: 3.11 — AB (ref 3.87–5.11)

## 2019-06-29 LAB — IRON,TIBC AND FERRITIN PANEL: Ferritin: 365.3

## 2019-06-29 NOTE — Progress Notes (Signed)
Location:    [place] Room Number: 308/D Place of Service:  SNF (31) Provider:  Granville Lewis  Patient, No Pcp Per  Patient Care Team: Patient, No Pcp Per as PCP - General (General Practice) Micheal Duos, MD as Consulting Physician (Internal Medicine)  Extended Emergency Contact Information Primary Emergency Contact: Zadie Rhine Address: [address]          [place], [place] [address] Micheal Matthews of [place] Phone: [phone] Relation: Daughter Secondary Emergency Contact: Micheal Matthews Address: [address]          [place], [place] [address] Micheal Matthews of [place] Mobile Phone: [phone] Relation: Son  Code Status:  DNR Goals of care: Advanced Directive information Advanced Directives 06/29/2019  Does Patient Have a Medical Advance Directive? Yes  Type of Advance Directive Out of facility DNR (pink MOST or yellow form)  Does patient want to make changes to medical advance directive? No - Patient declined  Pre-existing out of facility DNR order (yellow form or pink MOST form) Yellow form placed in chart (order not valid for inpatient use)     Chief Complaint  Patient presents with  . Follow-up    Follow up Chest X-ray and Labs    HPI:  Pt is a 74 y.o. male seen today for an acute visit for follow-up of labs as well as chest x-ray which does not show any acute process.  Per staff patient is doing well and actually has gained strength during his stay here.  He does have a history of severe dementia as well as amyloid angiopathy in addition to history of CVA urinary bladder cancer polycythemia.  He was admitted to the hospital in late November early December for poor p.o. intake and weakness.  Family did not wish to pursue alternative means of nutrition and minimal treatment with IV fluids and antibiotics.  He was found to have a UTI and treated with Rocephin he also had acute renal failure with creatinine at 1.3.17  this improved down to 1.8 on discharge.  He was started on sodium bicarb.  Follow-up labs  todayhowed significant improvement with a creatinine of 1.02.  He did have a C-reactive protein done which was 7.75.  Hemoglobin was 8.9-appears to been around 11 in the hospital I suspect there was some hemoconcentration because of his renal issues.  D-dimer also has been done which is elevated somewhat at 1.18.  This was discussed with Dr. Sheppard Coil and at this point will monitor.  Clinically he appears to be doing well vital signs are stable.  Apparently he had a rapid Covid test done that was negative but I believe the updated results are pending.  Currently he is sitting on the side of his bed comfortably he is more bright and alert than I have seen him previously      Past Medical History:  Diagnosis Date  . Acute on chronic kidney failure ([place])   . Advanced dementia ([place])    Secondary to amyloid angiopathy  . B12 deficiency anemia   . Bladder cancer ([place])   . Collapsed lung   . CVA (cerebral vascular accident) ([place])   . Dementia ([place])   . Pneumothorax   . Polycythemia   . Polycythemia vera ([place])   . Stroke [facility])    Past Surgical History:  Procedure Laterality Date  . BLADDER SURGERY    . CYSTOSCOPY WITH BLADDER BIOPSY  07/06/2017  . INSTILLATION OF INTRAVESICAL CHEMOTHERAPY  07/06/2017  .  LUNG SURGERY    . RETROGRADE PYLEGRAM Bilateral 07/06/2017  . TRANSURETHRAL RESECTION OF BLADDER TUMOR WITH GYRUS (TURBT-GYRUS)  07/06/2017    No Known Allergies  Outpatient Encounter Medications as of 06/29/2019  Medication Sig  . hydroxyurea (HYDREA) 500 MG capsule Take 500 mg by mouth every other day. May take with food to minimize GI side effects.  . thiamine 100 MG tablet Take 100 mg by mouth daily.  . [DISCONTINUED] bisacodyl (DULCOLAX) 10 MG suppository Place 10 mg rectally daily as needed for moderate constipation.  . [DISCONTINUED] magnesium hydroxide (MILK OF MAGNESIA)  400 MG/5ML suspension Take 30 mLs by mouth daily as needed for mild constipation.   No facility-administered encounter medications on file as of 06/29/2019.    Review of Systems   Was essentially unobtainable secondary to dementia please see HPI  Immunization History  Administered Date(s) Administered  . Influenza, High Dose Seasonal PF 05/12/2019  . Moderna SARS-COVID-2 Vaccination 06/09/2019  . Pneumococcal Polysaccharide-23 05/12/2019   Pertinent  Health Maintenance Due  Topic Date Due  . COLONOSCOPY  01/18/1995  . PNA vac Low Risk Adult (2 of 2 - PCV13) 05/11/2020  . INFLUENZA VACCINE  Completed   No flowsheet data found. Functional Status Survey:    Vitals:   06/29/19 1552  BP: 116/87  Pulse: 99  Resp: 18  Temp: 97.8 F (36.6 C)  TempSrc: Oral  SpO2: 91%  Weight: 113 lb 1.6 oz (51.3 kg)  Height: 5\' 3"  (1.6 m)   Body mass index is 20.03 kg/m. Physical Exam In general this is a pleasant elderly male who is in no distress he appears to be doing well.  His skin is warm and dry.  Eyes visual acuity appears to be intact-sclera and conjunctive are clear.  Oropharynx is clear mucous membranes moist.  Chest is clear to auscultation with reduced air entry in somewhat poor respiratory effort.  There is no labored breathing.  Heart is regular rate and rhythm without murmur gallop or rub he does not have significant lower extremity edema.  Abdomen is soft nontender with positive bowel sounds.  Musculoskeletal appears able to move all extremities x4 at baseline he is able to stand but is unsteady.  Neurologic appears grossly intact cannot really appreciate lateralizing findings.  Psych he is oriented to self he is pleasant does have some difficulty following verbal commands Labs reviewed:  June 29, 2019.  Sodium 139 potassium 4.6 BUN 25 creatinine 1.02.  Alkaline phosphatase 179 otherwise liver function tests within normal limits except albumin of  2.7.  WBC 8.3 hemoglobin 8.9 platelets 360.  C-reactive protein 7.75.  Ferritin 365.3 Recent Labs    05/13/19 0000  NA 144  K 3.7  CL 116*  CO2 15  BUN 23*  CREATININE 1.4*  CALCIUM 8.1*   No results for input(s): AST, ALT, ALKPHOS, BILITOT, PROT, ALBUMIN in the last 8760 hours. Recent Labs    05/13/19 0000  WBC 7.7  NEUTROABS 6  HGB 11.8*  HCT 37*  PLT 103*   Lab Results  Component Value Date   TSH 1.069 02/02/2010   No results found for: HGBA1C No results found for: CHOL, HDL, LDLCALC, LDLDIRECT, TRIG, CHOLHDL  Significant Diagnostic Results in last 30 days:  No results found.  Assessment/Plan  #1-history of acute on chronic kidney disease this actually appears significantly improved with a creatinine of 1.02 on lab done today.  I do know hemoglobin is down at 8.9 this may be the result of  hemoconcentration secondary to previous acute kidney injury-will have this updated early next week.  Clinically he appears to be doing quite well.  2.  Abnormal labs with elevated D-dimer of 1.18-this has been discussed with Dr. Sheppard Coil via phone at this point no aggressive intervention-he does not show any signs of chest pain increased edema or any discomfort or shortness of breath.  Elevated C-reactive protein also was discussed with Dr. Sheppard Coil at this point will monitor.  3.-Covid???-it appears preliminary Covid test is negative per nursing-- but updated 1 is pending he is not complaining of any increased cough or shortness of breath chest x-ray did not show any acute process at this point will monitor and await updated test  .

## 2019-07-01 ENCOUNTER — Non-Acute Institutional Stay (SKILLED_NURSING_FACILITY): Payer: Medicare Other | Admitting: Internal Medicine

## 2019-07-01 DIAGNOSIS — U071 COVID-19: Secondary | ICD-10-CM | POA: Diagnosis not present

## 2019-07-04 ENCOUNTER — Non-Acute Institutional Stay (SKILLED_NURSING_FACILITY): Payer: Medicare Other | Admitting: Internal Medicine

## 2019-07-04 DIAGNOSIS — U071 COVID-19: Secondary | ICD-10-CM | POA: Diagnosis not present

## 2019-07-04 LAB — CBC AND DIFFERENTIAL
HCT: 28 — AB (ref 41–53)
Hemoglobin: 9.4 — AB (ref 13.5–17.5)
Neutrophils Absolute: 5
Platelets: 335 (ref 150–399)
WBC: 7.1

## 2019-07-04 LAB — CBC: RBC: 3.25 — AB (ref 3.87–5.11)

## 2019-07-08 LAB — BASIC METABOLIC PANEL
BUN: 23 — AB (ref 4–21)
CO2: 19 (ref 13–22)
Chloride: 103 (ref 99–108)
Creatinine: 1.1 (ref 0.6–1.3)
Glucose: 81
Potassium: 4.7 (ref 3.4–5.3)
Sodium: 139 (ref 137–147)

## 2019-07-08 LAB — CBC AND DIFFERENTIAL
HCT: 29 — AB (ref 41–53)
Hemoglobin: 9 — AB (ref 13.5–17.5)
Neutrophils Absolute: 8
Platelets: 345 (ref 150–399)
WBC: 10.6

## 2019-07-08 LAB — CBC: RBC: 3.27 — AB (ref 3.87–5.11)

## 2019-07-08 LAB — COMPREHENSIVE METABOLIC PANEL
Calcium: 8.8 (ref 8.7–10.7)
GFR calc Af Amer: 80.39
GFR calc non Af Amer: 69.36

## 2019-07-10 ENCOUNTER — Encounter: Payer: Self-pay | Admitting: Internal Medicine

## 2019-07-10 DIAGNOSIS — U071 COVID-19: Secondary | ICD-10-CM | POA: Insufficient documentation

## 2019-07-10 NOTE — Progress Notes (Signed)
Location:   [facility] of Service:   SNF Provider:Niel Peretti D Angelo Caroll MD  Patient, No Pcp Per  Patient Care Team: Patient, No Pcp Per as PCP - General (General Practice) Hennie Duos, MD as Consulting Physician (Internal Medicine)  Extended Emergency Contact Information Primary Emergency Contact: Zadie Rhine Address: [address]          [place], [place] [address] [name] of [place] Phone: [phone] Relation: Daughter Secondary Emergency Contact: Gaspar Skeeters Address: [address]          [place], [place] [address] [name] of [place] Phone: [phone] Relation: Son    Allergies: Patient has no known allergies.  Chief Complaint  Patient presents with  . Acute Visit    HPI: Patient is 74 y.o. male who is being seen because he is newly Covid positive.  Patient is reported to have no symptoms but his O2 saturation is 86-92 on room air at the bedside with a pulse of 95 239 and is normal O2 saturations 96% on room air.  Patient has had no reported fever cough chest pain shortness of breath nausea vomiting diarrhea loss of taste or smell.  Past Medical History:  Diagnosis Date  . Acute on chronic kidney failure ([place])   . Advanced dementia ([place])    Secondary to amyloid angiopathy  . B12 deficiency anemia   . Bladder cancer ([place])   . Collapsed lung   . CVA (cerebral vascular accident) ([place])   . Dementia ([place])   . Pneumothorax   . Polycythemia   . Polycythemia vera ([place])   . Stroke [facility])     Past Surgical History:  Procedure Laterality Date  . BLADDER SURGERY    . CYSTOSCOPY WITH BLADDER BIOPSY  07/06/2017  . INSTILLATION OF INTRAVESICAL CHEMOTHERAPY  07/06/2017  . LUNG SURGERY    . RETROGRADE PYLEGRAM Bilateral 07/06/2017  . TRANSURETHRAL RESECTION OF BLADDER TUMOR WITH GYRUS (TURBT-GYRUS)  07/06/2017    Allergies as of 07/01/2019   No Known Allergies     Medication List       Accurate as of July 01, 2019 11:59  PM. If you have any questions, ask your nurse or doctor.        hydroxyurea 500 MG capsule Commonly known as: HYDREA Take 500 mg by mouth every other day. May take with food to minimize GI side effects.   thiamine 100 MG tablet Take 100 mg by mouth daily.       No orders of the defined types were placed in this encounter.   Immunization History  Administered Date(s) Administered  . Influenza, High Dose Seasonal PF 05/12/2019  . Moderna SARS-COVID-2 Vaccination 06/09/2019  . Pneumococcal Polysaccharide-23 05/12/2019    Social History   Tobacco Use  . Smoking status: Former Research scientist (life sciences)  . Smokeless tobacco: Current User    Types: Chew  Substance Use Topics  . Alcohol use: Yes    Alcohol/week: 3.0 standard drinks    Types: 3 Cans of beer per week    Comment: 3 beers/day    Review of Systems  Patient can participate minimally GENERAL:  no fevers, fatigue, appetite changes SKIN: No itching, rash HEENT: No complaint RESPIRATORY: No cough, wheezing, SOB CARDIAC: No chest pain, palpitations, lower extremity edema  GI: No abdominal pain, No N/V/D or constipation, No heartburn or reflux  GU: No dysuria, frequency or urgency, or incontinence  MUSCULOSKELETAL: No unrelieved bone/joint pain NEUROLOGIC: No headache, dizziness  PSYCHIATRIC: No overt  anxiety or sadness  Vitals:   07/10/19 1404  BP: 131/87  Pulse: 78  Resp: 18  Temp: 98.3 F (36.8 C)   There is no height or weight on file to calculate BMI. Physical Exam  GENERAL APPEARANCE: Alert, conversant, very tired, no acute distress SKIN: No diaphoresis rash HEENT: Unremarkable RESPIRATORY: Breathing is even, unlabored. Lung sounds are clear; bedside pulse oximetry 86 to 92% on room air, with a pulse of 95-139 CARDIOVASCULAR: Heart RRR no murmurs, rubs or gallops. No peripheral edema  GASTROINTESTINAL: Abdomen is soft, non-tender, not distended w/ normal bowel sounds.  GENITOURINARY: Bladder non tender, not  distended  MUSCULOSKELETAL: No abnormal joints or musculature except very thin NEUROLOGIC: Cranial nerves 2-12 grossly intact. Moves all extremities PSYCHIATRIC: Mood and affect appropriate with dementia, no behavioral issues  Patient Active Problem List   Diagnosis Date Noted  . Sepsis ([place]) 05/14/2019  . Sepsis due to gram-negative UTI ([place]) 05/14/2019  . Dementia associated with other underlying disease ([place]) 05/14/2019  . Cerebral amyloid angiopathy ([place]) 05/14/2019  . Failure to thrive in adult 05/14/2019  . Acute kidney injury superimposed on CKD ([place]) 05/14/2019  . Polycythemia vera ([place]) 05/14/2019  . Metabolic acidosis [id number]    CMP     Component Value Date/Time   NA 139 06/29/2019 0000   K 4.6 06/29/2019 0000   CL 106 06/29/2019 0000   CO2 24 (A) 06/29/2019 0000   GLUCOSE 104 (H) 09/03/2014 1440   BUN 25 (A) 06/29/2019 0000   CREATININE 1.0 06/29/2019 0000   CREATININE 0.98 09/03/2014 1440   CALCIUM 8.7 06/29/2019 0000   PROT 7.5 09/03/2014 1440   ALBUMIN 2.7 (A) 06/29/2019 0000   AST 24 06/29/2019 0000   ALT 11 06/29/2019 0000   ALKPHOS 179 (A) 06/29/2019 0000   BILITOT 2.0 (H) 09/03/2014 1440   GFRNONAA 71.84 06/29/2019 0000   GFRAA 83.27 06/29/2019 0000   Recent Labs    05/13/19 0000 06/29/19 0000  NA 144 139  K 3.7 4.6  CL 116* 106  CO2 15 24*  BUN 23* 25*  CREATININE 1.4* 1.0  CALCIUM 8.1* 8.7   Recent Labs    06/29/19 0000  AST 24  ALT 11  ALKPHOS 179*  ALBUMIN 2.7*   Recent Labs    05/13/19 0000 06/29/19 0000  WBC 7.7 8.3  NEUTROABS 6  --   HGB 11.8* 8.9*  HCT 37* 28*  PLT 103* 360   No results for input(s): CHOL, LDLCALC, TRIG in the last 8760 hours.  Invalid input(s): HCL No results found for: MICROALBUR Lab Results  Component Value Date   TSH 1.069 02/02/2010   No results found for: HGBA1C No results found for: CHOL, HDL, LDLCALC, LDLDIRECT, TRIG, CHOLHDL  Significant Diagnostic Results in last 30 days:  No  results found.  Assessment and Plan  Coronavirus 19 positive-because of oxygen saturation; chest x-ray, also BMP CBC and D-dimer; patient has been put on hold Covid cocktail" of vitamin C at 1000 mg daily for 14 days, vitamin D 50,000 units weekly for 2 weeks, and zinc sulfate 220 mg daily for 14 days.  Patient has received his first Covid vaccine, and was [place].  Full PPE was required for this visit.   Hennie Duos, MD

## 2019-07-10 NOTE — Progress Notes (Signed)
Location:   [facility] Service:   SNF  Patient, No Pcp Per  Patient Care Team: Patient, No Pcp Per as PCP - General (General Practice) Hennie Duos, MD as Consulting Physician (Internal Medicine)  Extended Emergency Contact Information Primary Emergency Contact: Zadie Rhine Address: [address]          [place], [place] [address] [name] of [place] Phone: [phone] Relation: Daughter Secondary Emergency Contact: Gaspar Skeeters Address: [address]          [place], [place] [address] [place] of [employer] Phone: [phone] Relation: Son    Allergies: Patient has no known allergies.  Chief Complaint  Patient presents with  . Acute Visit    HPI: Patient is 74 y.o. male who is being seen in follow-up for his visit yesterday for becoming Covid positive..  Patient's labs are okay and patient continues to be without symptoms.  Past Medical History:  Diagnosis Date  . Acute on chronic kidney failure ([place])   . 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 B12 deficiency anemia   . Bladder cancer ([place])   . Collapsed lung   . CVA (cerebral vascular accident) ([place])   . Dementia ([place])   . Pneumothorax   . Polycythemia   . Polycythemia vera ([place])   . Stroke [facility])     Past Surgical History:  Procedure Laterality Date  . BLADDER SURGERY    . CYSTOSCOPY WITH BLADDER BIOPSY  07/06/2017  . INSTILLATION OF INTRAVESICAL CHEMOTHERAPY  07/06/2017  . LUNG SURGERY    . RETROGRADE PYLEGRAM Bilateral 07/06/2017  . TRANSURETHRAL RESECTION OF BLADDER TUMOR WITH GYRUS (TURBT-GYRUS)  07/06/2017    Allergies as of 07/04/2019   No Known Allergies     Medication List       Accurate as of July 04, 2019 11:59 PM. If you have any questions, ask your nurse or doctor.        hydroxyurea 500 MG capsule Commonly known as: HYDREA Take 500 mg by mouth every other day. May take with food to minimize GI side effects.     thiamine 100 MG tablet Take 100 mg by mouth daily.       No orders of the defined types were placed in this encounter.   Immunization History  Administered Date(s) Administered  . Influenza, High Dose Seasonal PF 05/12/2019  . Moderna SARS-COVID-2 Vaccination 06/09/2019  . Pneumococcal Polysaccharide-23 05/12/2019    Social History   Tobacco Use  . Smoking status: Former Research scientist (life sciences)  . Smokeless tobacco: Current User    Types: Chew  Substance Use Topics  . Alcohol use: Yes    Alcohol/week: 3.0 standard drinks    Types: 3 Cans of beer per week    Comment: 3 beers/day    Review of Systems  GENERAL:  no fevers, fatigue, appetite changes SKIN: No itching, rash HEENT: No complaint RESPIRATORY: No cough, wheezing, SOB CARDIAC: No chest pain, palpitations, lower extremity edema  GI: No abdominal pain, No N/V/D or constipation, No heartburn or reflux  GU: No dysuria, frequency or urgency, or incontinence  MUSCULOSKELETAL: No unrelieved bone/joint pain NEUROLOGIC: No headache, dizziness  PSYCHIATRIC: No overt anxiety or sadness  Vitals:   07/10/19 1618  BP: 110/62  Pulse: 63  Resp: 18  Temp: 98 F (36.7 C)   There is no height or weight on file to calculate BMI. Physical Exam  GENERAL APPEARANCE: Alert, conversant, No acute distress  except looks tired SKIN: No diaphoresis rash HEENT: Unremarkable RESPIRATORY: Breathing is even, unlabored. Lung sounds are clear   CARDIOVASCULAR: Heart RRR no murmurs, rubs or gallops. No peripheral edema  GASTROINTESTINAL: Abdomen is soft, non-tender, not distended w/ normal bowel sounds.  GENITOURINARY: Bladder non tender, not distended  MUSCULOSKELETAL: No abnormal joints or musculature NEUROLOGIC: Cranial nerves 2-12 grossly intact. Moves all extremities PSYCHIATRIC: Mood and affect appropriate to situation, no behavioral issues  Patient Active Problem List   Diagnosis Date Noted  . Real time reverse transcriptase PCR positive  for 2019 novel coronavirus 07/10/2019  . Sepsis ([place]) 05/14/2019  . Sepsis due to gram-negative UTI ([place]) 05/14/2019  . Dementia associated with other underlying disease ([place]) 05/14/2019  . Cerebral amyloid angiopathy (CODE) 05/14/2019  . Failure to thrive in adult 05/14/2019  . Acute kidney injury superimposed on CKD ([place]) 05/14/2019  . Polycythemia vera ([place]) 05/14/2019  . Metabolic acidosis [id number]    CMP     Component Value Date/Time   NA 139 06/29/2019 0000   K 4.6 06/29/2019 0000   CL 106 06/29/2019 0000   CO2 24 (A) 06/29/2019 0000   GLUCOSE 104 (H) 09/03/2014 1440   BUN 25 (A) 06/29/2019 0000   CREATININE 1.0 06/29/2019 0000   CREATININE 0.98 09/03/2014 1440   CALCIUM 8.7 06/29/2019 0000   PROT 7.5 09/03/2014 1440   ALBUMIN 2.7 (A) 06/29/2019 0000   AST 24 06/29/2019 0000   ALT 11 06/29/2019 0000   ALKPHOS 179 (A) 06/29/2019 0000   BILITOT 2.0 (H) 09/03/2014 1440   GFRNONAA 71.84 06/29/2019 0000   GFRAA 83.27 06/29/2019 0000   Recent Labs    05/13/19 0000 06/29/19 0000  NA 144 139  K 3.7 4.6  CL 116* 106  CO2 15 24*  BUN 23* 25*  CREATININE 1.4* 1.0  CALCIUM 8.1* 8.7   Recent Labs    06/29/19 0000  AST 24  ALT 11  ALKPHOS 179*  ALBUMIN 2.7*   Recent Labs    05/13/19 0000 06/29/19 0000  WBC 7.7 8.3  NEUTROABS 6  --   HGB 11.8* 8.9*  HCT 37* 28*  PLT 103* 360   No results for input(s): CHOL, LDLCALC, TRIG in the last 8760 hours.  Invalid input(s): HCL No results found for: MICROALBUR Lab Results  Component Value Date   TSH 1.069 02/02/2010   No results found for: HGBA1C No results found for: CHOL, HDL, LDLCALC, LDLDIRECT, TRIG, CHOLHDL  Significant Diagnostic Results in last 30 days:  No results found.  Assessment and Plan  Coronavirus 19+-patient's WBC is 7.1, hemoglobin 9.4, chest x-ray negative, and D-dimer is 1470 which is approximately 3 times normal she is a pretty average number for Covid positive patient.  Patient was also  clear.  Patient has been so far; we will continue to monitor  Full PPE was required for this visit .   Hennie Duos, MD

## 2019-07-11 ENCOUNTER — Non-Acute Institutional Stay (SKILLED_NURSING_FACILITY): Payer: Medicare Other | Admitting: Internal Medicine

## 2019-07-11 ENCOUNTER — Encounter: Payer: Self-pay | Admitting: Internal Medicine

## 2019-07-11 DIAGNOSIS — F0280 Dementia in other diseases classified elsewhere without behavioral disturbance: Secondary | ICD-10-CM

## 2019-07-11 DIAGNOSIS — N179 Acute kidney failure, unspecified: Secondary | ICD-10-CM | POA: Diagnosis not present

## 2019-07-11 DIAGNOSIS — N189 Chronic kidney disease, unspecified: Secondary | ICD-10-CM

## 2019-07-11 DIAGNOSIS — U071 COVID-19: Secondary | ICD-10-CM | POA: Diagnosis not present

## 2019-07-11 DIAGNOSIS — D45 Polycythemia vera: Secondary | ICD-10-CM

## 2019-07-11 NOTE — Progress Notes (Signed)
Location:    [facility] Room Number: 422/D Place of Service:  SNF (31) Provider: Lorne Skeens   Patient, No Pcp Per  Patient Care Team: Patient, No Pcp Per as PCP - General (General Practice) Hennie Duos, MD as Consulting Physician (Internal Medicine)  Extended Emergency Contact Information Primary Emergency Contact: Zadie Rhine Address: [address]          [place], [place] [address] [name] of [place] Phone: [phone] Relation: Daughter Secondary Emergency Contact: Gaspar Skeeters Address: [address]          [place], [place] [address] [name] of [place] Mobile Phone: [phone] Relation: Son  Code Status:  DNR Goals of care: Advanced Directive information Advanced Directives 07/11/2019  Does Patient Have a Medical Advance Directive? Yes  Type of Advance Directive Out of facility DNR (pink MOST or yellow form)  Does patient want to make changes to medical advance directive? No - Patient declined  Pre-existing out of facility DNR order (yellow form or pink MOST form) Yellow form placed in chart (order not valid for inpatient use)     Chief Complaint  Patient presents with  . Medical Management of Chronic Issues    Routine visit of medical management   Medical management of chronic medical conditions including severe dementia-history of chronic kidney disease-history of polycythemia-as well as recent diagnosis of COVID-19 asymptomatic HPI:  Pt is a 74 y.o. male seen today for medical management of chronic diseases.  Including severe dementia chronic kidney disease polycythemia as well as recent diagnosis of COVID-19.  Most recent acute issue was a recent diagnosis of COVID-19 however he has remained asymptomatic and is about to leave isolation.  He is a poor historian secondary to dementia but vital signs have been stable and nursing staff does not report any concerns with shortness of breath increased cough  congestion increased weakness from baseline.  He was admitted to the hospital Late last year for poor p.o. intake and weakness-family did not wish to pursue alternative means of nutrition and minimal treatment with IV fluids and antibiotics.  He was treated for a UTI and hydrated secondary to an elevated creatinine this has improved with recent creatinine 1.05 on lab done on January 29.  He also has a history of anemia at 1 point hemoglobin was 8.9 but update labs shows it is up to 9.3.  In response to Covid he was treated with the Covid cocktail vitamin C vitamin D and zinc-D-dimer was elevated but this was thought secondary to the Covid and within acceptable range.  Currently he is lying in bed comfortably-he is a poor historian secondary to dementia but nursing does not report any recent acute issues  Past Medical History:  Diagnosis Date  . Acute on chronic kidney failure ([place])   . 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 B12 deficiency anemia   . Bladder cancer ([place])   . Collapsed lung   . CVA (cerebral vascular accident) ([place])   . Dementia ([place])   . Pneumothorax   . Polycythemia   . Polycythemia vera ([place])   . Stroke [facility])    Past Surgical History:  Procedure Laterality Date  . BLADDER SURGERY    . CYSTOSCOPY WITH BLADDER BIOPSY  07/06/2017  . INSTILLATION OF INTRAVESICAL CHEMOTHERAPY  07/06/2017  . LUNG SURGERY    . RETROGRADE PYLEGRAM Bilateral 07/06/2017  . TRANSURETHRAL RESECTION OF BLADDER TUMOR WITH GYRUS (TURBT-GYRUS)  07/06/2017  No Known Allergies  Allergies as of 07/11/2019   No Known Allergies     Medication List       Accurate as of July 11, 2019 10:33 AM. If you have any questions, ask your nurse or doctor.        ascorbic acid 500 MG tablet Commonly known as: VITAMIN C Take 1,000 mg by mouth daily.   hydroxyurea 500 MG capsule Commonly known as: HYDREA Take 500 mg by mouth every other day. May take with food to  minimize GI side effects.   thiamine 100 MG tablet Take 100 mg by mouth daily.   Vitamin D3 1.25 MG (50000 UT) Tabs Take 1 tablet by mouth once a week.   zinc sulfate 220 (50 Zn) MG capsule Take 220 mg by mouth daily.       Review of Systems   Is unobtainable secondary to dementia please see HPI  Immunization History  Administered Date(s) Administered  . Influenza, High Dose Seasonal PF 05/12/2019  . Moderna SARS-COVID-2 Vaccination 06/09/2019  . Pneumococcal Polysaccharide-23 05/12/2019   Pertinent  Health Maintenance Due  Topic Date Due  . COLONOSCOPY  01/18/1995  . PNA vac Low Risk Adult (2 of 2 - PCV13) 05/11/2020  . INFLUENZA VACCINE  Completed   No flowsheet data found. Functional Status Survey:    Vitals:   07/11/19 1027  BP: 92/63  Pulse: 93  Resp: 19  Temp: 99.1 F (37.3 C)  TempSrc: Oral  SpO2: 91%  Weight: 113 lb 1.6 oz (51.3 kg)  Height: 5\' 3"  (1.6 m)   Body mass index is 20.03 kg/m. Physical Exam   In general this is a frail elderly male resting comfortably in bed he does not appear to be in any distress appears comfortable.  Skin is warm and dry.  Eyes visual acuity appears grossly intact sclera and conjunctive are clear.  Oropharynx is clear mucous membranes moist.  Chest is clear to auscultation with poor respiratory effort cannot appreciate any labored breathing.  Heart is somewhat distant heart sounds regular rate and rhythm-she does not have significant edema.  Abdomen is soft nontender with positive bowel sounds.  Musculoskeletal is able to move all extremities x4 he actually is able to ambulate but is quite unsteady.  Neurologic appears grossly intact cannot appreciate lateralizing findings cranial nerves appear to be intact.  Psych he is oriented to self only he is cooperative with exam at times has trouble however following any simple verbal commands.    Labs reviewed:  July 08, 2019.  WBC 10.6 hemoglobin 9.3  platelets 345.  Sodium 139 potassium 4.7 BUN 22.6 creatinine 1.05.   Recent Labs    05/13/19 0000 06/29/19 0000  NA 144 139  K 3.7 4.6  CL 116* 106  CO2 15 24*  BUN 23* 25*  CREATININE 1.4* 1.0  CALCIUM 8.1* 8.7   Recent Labs    06/29/19 0000  AST 24  ALT 11  ALKPHOS 179*  ALBUMIN 2.7*   Recent Labs    05/13/19 0000 06/29/19 0000  WBC 7.7 8.3  NEUTROABS 6  --   HGB 11.8* 8.9*  HCT 37* 28*  PLT 103* 360   Lab Results  Component Value Date   TSH 1.069 02/02/2010   No results found for: HGBA1C No results found for: CHOL, HDL, LDLCALC, LDLDIRECT, TRIG, CHOLHDL  Significant Diagnostic Results in last 30 days:  No results found.  Assessment/Plan  #1 history of Covid positive-he appears to be stable  and continues to be asymptomatic apparently he will be moving from the isolation unit shortly-.  2.  Severe dementia at this point continue supportive care again in hospital family did not wish aggressive treatment or artificial feeding means-his Exelon and Risperdal have been discontinued continue comfort measures.  3.  History of chronic kidney disease this appears stable with recent creatinine of 1.05.  4.  History of polycythemia he continues on hydroxyurea last hemoglobin was 9.3 white count 10.6 platelets 345,000.  [id number]

## 2019-07-12 ENCOUNTER — Encounter: Payer: Self-pay | Admitting: Internal Medicine

## 2019-08-02 ENCOUNTER — Encounter: Payer: Self-pay | Admitting: Internal Medicine

## 2019-08-02 ENCOUNTER — Non-Acute Institutional Stay (SKILLED_NURSING_FACILITY): Payer: Medicare Other | Admitting: Internal Medicine

## 2019-08-02 DIAGNOSIS — R627 Adult failure to thrive: Secondary | ICD-10-CM

## 2019-08-02 DIAGNOSIS — F0280 Dementia in other diseases classified elsewhere without behavioral disturbance: Secondary | ICD-10-CM | POA: Diagnosis not present

## 2019-08-02 DIAGNOSIS — E872 Acidosis, unspecified: Secondary | ICD-10-CM

## 2019-08-02 DIAGNOSIS — N189 Chronic kidney disease, unspecified: Secondary | ICD-10-CM

## 2019-08-02 DIAGNOSIS — A4150 Gram-negative sepsis, unspecified: Secondary | ICD-10-CM

## 2019-08-02 DIAGNOSIS — I680 Cerebral amyloid angiopathy: Secondary | ICD-10-CM | POA: Diagnosis not present

## 2019-08-02 DIAGNOSIS — N179 Acute kidney failure, unspecified: Secondary | ICD-10-CM

## 2019-08-02 DIAGNOSIS — N390 Urinary tract infection, site not specified: Secondary | ICD-10-CM

## 2019-08-02 DIAGNOSIS — D45 Polycythemia vera: Secondary | ICD-10-CM

## 2019-08-02 DIAGNOSIS — U071 COVID-19: Secondary | ICD-10-CM

## 2019-08-02 MED ORDER — THIAMINE HCL 100 MG PO TABS: 100.0000 mg | ORAL_TABLET | Freq: Every day | ORAL | 0 refills | Status: AC

## 2019-08-02 MED ORDER — MIRTAZAPINE 7.5 MG PO TABS: 7.5000 mg | ORAL_TABLET | Freq: Every day | ORAL | 0 refills | Status: AC

## 2019-08-02 MED ORDER — HYDROXYUREA 500 MG PO CAPS: 500.0000 mg | ORAL_CAPSULE | ORAL | 0 refills | Status: AC

## 2019-08-02 NOTE — Progress Notes (Signed)
Location:  [facility] Room Number: 308-D Place of Service:  SNF (31)SNF  PCP: Patient, No Pcp Per Patient Care Team: Patient, No Pcp Per as PCP - General (General Practice) Hennie Duos, MD as Consulting Physician (Internal Medicine)  Extended Emergency Contact Information Primary Emergency Contact: Zadie Rhine Address: [address]          [place], [place] [address] [name] of [place] Phone: [phone] Relation: Daughter Secondary Emergency Contact: Gaspar Skeeters Address: [address]          [place], [place] [address] [name] of [employer] Phone: [phone] Relation: Son  No Known Allergies   Chief Complaint  Patient presents with  . Discharge Note    Patient to discharge from SNF 08/03/19    HPI:  74 y.o. male with severe dementia, amyloid angiopathy, stroke, urinary bladder cancer, polycythemia who was admitted to [facility] from 11/28-12/3 for decreased appetite and p.o. intake for the prior 2 weeks and decreased mobility for the prior month.  Patient and then down for 1 month per daughter due to weakness and has had significant decline functionally and cognitively.  Initially it was discussed with daughter that the patient's living will describes a DNR and daughter did not want to make her father a DNR, as well as patient in hospice as she does not want to pursue alternate means of nutrition and would want minimal treatment IV fluids and antibiotics.  Patient was found to be septic from UTI with Aerococcus, treated with Rocephin and had acute renal failure.  His creatinine increased from 1.5-3.17 and at discharge had improved back to 1.8.Patient had metabolic acidosis secondary acute on chronic kidney failure started on sodium bicarb.  Patient was admitted to skilled nursing facility for residential care and is now ready to be discharged to home with his daughter.  Patient did have Covid,  asymptomatic, in January and has received 1 dose of the [place] vaccine.  His second would have been given when he was Covid positive.   Past Medical History:  Diagnosis Date  . Acute on chronic kidney failure ([place])   . 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 B12 deficiency anemia   . Bladder cancer ([place])   . Collapsed lung   . CVA (cerebral vascular accident) ([place])   . Dementia ([place])   . Pneumothorax   . Polycythemia   . Polycythemia vera ([place])   . Stroke [facility])     Past Surgical History:  Procedure Laterality Date  . BLADDER SURGERY    . CYSTOSCOPY WITH BLADDER BIOPSY  07/06/2017  . INSTILLATION OF INTRAVESICAL CHEMOTHERAPY  07/06/2017  . LUNG SURGERY    . RETROGRADE PYLEGRAM Bilateral 07/06/2017  . TRANSURETHRAL RESECTION OF BLADDER TUMOR WITH GYRUS (TURBT-GYRUS)  07/06/2017     reports that he has quit smoking. His smokeless tobacco use includes chew. He reports current alcohol use of about 3.0 standard drinks of alcohol per week. He reports that he does not use drugs. Social History   Socioeconomic History  . Marital status: Widowed    Spouse name: Not on file  . Number of children: Not on file  . Years of education: Not on file  . Highest education level: Not on file  Occupational History  . Not on file  Tobacco Use  . Smoking status: Former Research scientist (life sciences)  . Smokeless tobacco: Current User    Types: Chew  Substance  and Sexual Activity  . Alcohol use: Yes    Alcohol/week: 3.0 standard drinks    Types: 3 Cans of beer per week    Comment: 3 beers/day  . Drug use: No  . Sexual activity: Not on file  Other Topics Concern  . Not on file  Social History Narrative   Widowed. Former smoker, quit in 1970. Current smokeless tobacco user (chew). Beer drinker.   Social Determinants of Health   Financial Resource Strain:   . Difficulty of Paying Living Expenses: Not on file  Food Insecurity:   . Worried About Charity fundraiser in the Last Year: Not  on file  . Ran Out of Food in the Last Year: Not on file  Transportation Needs:   . Lack of Transportation (Medical): Not on file  . Lack of Transportation (Non-Medical): Not on file  Physical Activity:   . Days of Exercise per Week: Not on file  . Minutes of Exercise per Session: Not on file  Stress:   . Feeling of Stress : Not on file  Social Connections:   . Frequency of Communication with Friends and Family: Not on file  . Frequency of Social Gatherings with Friends and Family: Not on file  . Attends Religious Services: Not on file  . Active Member of Clubs or Organizations: Not on file  . Attends Archivist Meetings: Not on file  . Marital Status: Not on file  Intimate Partner Violence:   . Fear of Current or Ex-Partner: Not on file  . Emotionally Abused: Not on file  . Physically Abused: Not on file  . Sexually Abused: Not on file    Pertinent  Health Maintenance Due  Topic Date Due  . COLONOSCOPY  01/18/1995  . PNA vac Low Risk Adult (2 of 2 - PCV13) 05/09/2020  . INFLUENZA VACCINE  Completed    Medications: Allergies as of 08/02/2019   No Known Allergies     Medication List       Accurate as of August 02, 2019  3:36 PM. If you have any questions, ask your nurse or doctor.        hydroxyurea 500 MG capsule Commonly known as: HYDREA Take 1 capsule (500 mg total) by mouth every other day. May take with food to minimize GI side effects.   mirtazapine 7.5 MG tablet Commonly known as: REMERON Take 1 tablet (7.5 mg total) by mouth at bedtime.   thiamine 100 MG tablet Take 1 tablet (100 mg total) by mouth daily.        Vitals:   08/02/19 1416  BP: 133/75  Pulse: 74  Resp: 18  Temp: 100.3 F (37.9 C)  TempSrc: Oral  Weight: 96 lb 3.2 oz (43.6 kg)  Height: 5\' 3"  (1.6 m)   Body mass index is 17.04 kg/m.  Physical Exam  GENERAL APPEARANCE: Alert, conversant. No acute distress.  HEENT: Unremarkable. RESPIRATORY: Breathing is even,  unlabored. Lung sounds are clear   CARDIOVASCULAR: Heart RRR no murmurs, rubs or gallops. No peripheral edema.  GASTROINTESTINAL: Abdomen is soft, non-tender, not distended w/ normal bowel sounds.  NEUROLOGIC: Cranial nerves 2-12 grossly intact. Moves all extremities   Labs reviewed: Basic Metabolic Panel: Recent Labs    05/13/19 0000 06/29/19 0000 07/08/19 0000  NA 144 139 139  K 3.7 4.6 4.7  CL 116* 106 103  CO2 15 24* 19  BUN 23* 25* 23*  CREATININE 1.4* 1.0 1.1  CALCIUM 8.1* 8.7 8.8  No results found for: [facility] Liver Function Tests: Recent Labs    06/29/19 0000  AST 24  ALT 11  ALKPHOS 179*  ALBUMIN 2.7*   No results for input(s): LIPASE, AMYLASE in the last 8760 hours. No results for input(s): AMMONIA in the last 8760 hours. CBC: Recent Labs    05/13/19 0000 05/13/19 0000 06/29/19 0000 07/04/19 0000 07/08/19 0000  WBC 7.7   < > 8.3 7.1 10.6  NEUTROABS 6  --   --  5 8  HGB 11.8*   < > 8.9* 9.4* 9.0*  HCT 37*   < > 28* 28* 29*  PLT 103*   < > 360 335 345   < > = values in this interval not displayed.   Lipid No results for input(s): CHOL, HDL, LDLCALC, TRIG in the last 8760 hours. Cardiac Enzymes: No results for input(s): CKTOTAL, CKMB, CKMBINDEX, TROPONINI in the last 8760 hours. BNP: No results for input(s): BNP in the last 8760 hours. CBG: No results for input(s): GLUCAP in the last 8760 hours.  Procedures and Imaging Studies During Stay: No results found.  Assessment/Plan:   Sepsis due to gram-negative UTI (HCC)  Dementia associated with other underlying disease without behavioral disturbance (HCC)  Cerebral amyloid angiopathy ([place])  Failure to thrive in adult  Metabolic acidosis  Acute kidney injury superimposed on CKD (HCC)  Polycythemia vera (HCC)  Real time reverse transcriptase PCR positive for 2019 novel coronavirus   Patient is being discharged with the following home health services: OT/PT/SW/Aide  Patient is being  discharged with the following durable medical equipment semielectric hospital bed, rolling walker, bedside commode  Patient has been advised to f/u with their PCP in 1-2 weeks to bring them up to date on their rehab stay.  Social services at facility was responsible for arranging this appointment.  Pt was provided with a 30 day supply of prescriptions for medications and refills must be obtained from their PCP.  For controlled substances, a more limited supply may be provided adequate until PCP appointment only.  Pt can get his second vaccine shot here on Thursday 08/04/19.  Time spent greater than 30 minutes;> 50% of time with patient was spent reviewing records, labs, tests and studies, counseling and developing plan of care  Hennie Duos MD

## 2019-09-08 DEATH — deceased
